# Patient Record
Sex: Male | Born: 1940
Health system: Southern US, Community
[De-identification: ages and names within clinical notes are randomized; demographics above are authoritative.]

## PROBLEM LIST (undated history)

## (undated) DIAGNOSIS — Z87442 Personal history of urinary calculi: Secondary | ICD-10-CM

## (undated) DIAGNOSIS — I10 Essential (primary) hypertension: Secondary | ICD-10-CM

## (undated) DIAGNOSIS — M1990 Unspecified osteoarthritis, unspecified site: Secondary | ICD-10-CM

## (undated) DIAGNOSIS — E785 Hyperlipidemia, unspecified: Secondary | ICD-10-CM

## (undated) HISTORY — DX: Essential (primary) hypertension: I10

## (undated) HISTORY — PX: OTHER SURGICAL HISTORY: SHX169

## (undated) HISTORY — DX: Hyperlipidemia, unspecified: E78.5

---

## 2002-07-15 ENCOUNTER — Ambulatory Visit (HOSPITAL_COMMUNITY): Admission: RE | Admit: 2002-07-15 | Discharge: 2002-07-15 | Payer: Self-pay | Admitting: Orthopaedic Surgery

## 2002-07-15 ENCOUNTER — Encounter: Payer: Self-pay | Admitting: Orthopaedic Surgery

## 2002-07-27 ENCOUNTER — Ambulatory Visit (HOSPITAL_BASED_OUTPATIENT_CLINIC_OR_DEPARTMENT_OTHER): Admission: RE | Admit: 2002-07-27 | Discharge: 2002-07-27 | Payer: Self-pay | Admitting: Orthopaedic Surgery

## 2004-12-06 ENCOUNTER — Emergency Department (HOSPITAL_COMMUNITY): Admission: EM | Admit: 2004-12-06 | Discharge: 2004-12-06 | Payer: Self-pay | Admitting: Emergency Medicine

## 2008-04-13 ENCOUNTER — Emergency Department (HOSPITAL_COMMUNITY): Admission: EM | Admit: 2008-04-13 | Discharge: 2008-04-13 | Payer: Self-pay | Admitting: Emergency Medicine

## 2008-08-03 ENCOUNTER — Ambulatory Visit: Payer: Self-pay | Admitting: Internal Medicine

## 2008-08-03 LAB — CONVERTED CEMR LAB
ALT: 23 units/L (ref 0–53)
Albumin: 3.9 g/dL (ref 3.5–5.2)
BUN: 13 mg/dL (ref 6–23)
Bilirubin Urine: NEGATIVE
Bilirubin, Direct: 0.1 mg/dL (ref 0.0–0.3)
CO2: 30 meq/L (ref 19–32)
Calcium: 8.8 mg/dL (ref 8.4–10.5)
Creatinine, Ser: 0.8 mg/dL (ref 0.4–1.5)
GFR calc Af Amer: 124 mL/min
Glucose, Bld: 102 mg/dL — ABNORMAL HIGH (ref 70–99)
HDL: 28.8 mg/dL — ABNORMAL LOW (ref >39.0)
Hemoglobin, Urine: NEGATIVE
Hemoglobin: 15.4 g/dL (ref 13.0–17.0)
Ketones, ur: NEGATIVE mg/dL
LDL Cholesterol: 79 mg/dL (ref 0–99)
Leukocytes, UA: NEGATIVE
Lymphocytes Relative: 15.5 % (ref 12.0–46.0)
MCHC: 34.3 g/dL (ref 30.0–36.0)
Monocytes Relative: 11 % (ref 3.0–12.0)
Neutrophils Relative %: 67.5 % (ref 43.0–77.0)
Nitrite: NEGATIVE
Platelets: 140 10*3/uL — ABNORMAL LOW (ref 150–400)
RDW: 12.7 % (ref 11.5–14.6)
TSH: 1.98 microintl units/mL (ref 0.35–5.50)
Total CHOL/HDL Ratio: 4.3
Total Protein: 6.3 g/dL (ref 6.0–8.3)
Urobilinogen, UA: 0.2 (ref 0.0–1.0)
pH: 5 (ref 5.0–8.0)

## 2008-08-09 ENCOUNTER — Encounter: Payer: Self-pay | Admitting: Internal Medicine

## 2008-08-25 ENCOUNTER — Ambulatory Visit: Payer: Self-pay | Admitting: Internal Medicine

## 2008-08-25 DIAGNOSIS — R351 Nocturia: Secondary | ICD-10-CM

## 2008-08-25 DIAGNOSIS — C029 Malignant neoplasm of tongue, unspecified: Secondary | ICD-10-CM | POA: Insufficient documentation

## 2008-08-25 DIAGNOSIS — N401 Enlarged prostate with lower urinary tract symptoms: Secondary | ICD-10-CM | POA: Insufficient documentation

## 2008-10-16 ENCOUNTER — Ambulatory Visit: Payer: Self-pay | Admitting: Internal Medicine

## 2008-10-16 DIAGNOSIS — M1710 Unilateral primary osteoarthritis, unspecified knee: Secondary | ICD-10-CM | POA: Insufficient documentation

## 2008-10-16 LAB — CONVERTED CEMR LAB
BUN: 17 mg/dL (ref 6–23)
Chloride: 110 meq/L (ref 96–112)
Creatinine, Ser: 0.8 mg/dL (ref 0.4–1.5)
Eosinophils Relative: 3.5 % (ref 0.0–5.0)
GFR calc Af Amer: 124 mL/min
Glucose, Bld: 101 mg/dL — ABNORMAL HIGH (ref 70–99)
HCT: 42.8 % (ref 39.0–52.0)
Monocytes Relative: 12.9 % — ABNORMAL HIGH (ref 3.0–12.0)
Neutrophils Relative %: 64.4 % (ref 43.0–77.0)
Platelets: 244 10*3/uL (ref 150–400)
Potassium: 4 meq/L (ref 3.5–5.1)
RDW: 12.7 % (ref 11.5–14.6)
WBC: 5.8 10*3/uL (ref 4.5–10.5)

## 2009-01-16 ENCOUNTER — Ambulatory Visit: Payer: Self-pay | Admitting: Internal Medicine

## 2009-05-01 ENCOUNTER — Emergency Department (HOSPITAL_COMMUNITY): Admission: EM | Admit: 2009-05-01 | Discharge: 2009-05-01 | Payer: Self-pay | Admitting: Emergency Medicine

## 2009-07-17 ENCOUNTER — Ambulatory Visit: Payer: Self-pay | Admitting: Internal Medicine

## 2010-01-04 IMAGING — CT CT PELVIS W/O CM
2 of 4 series · 16 of 46 positions shown, 18 images · non-contrast
Comparison: None

CT ABDOMEN

CLINICAL DATA: Right flank pain

CT ABDOMEN AND PELVIS WITHOUT CONTRAST
TECHNIQUE: Multidetector CT imaging of the abdomen and pelvis was
performed following the standard protocol without intravenous
contrast.

[Series 2: under 200# stone no prev · axial · 0.68mm/px · z∈[+926,+1330]mm · 13 of 89 slices shown, 15 images]
[im 4/89  soft-tissue]
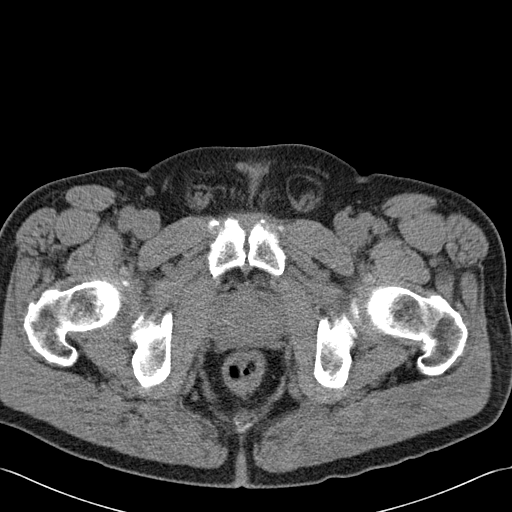
[im 4/89  bone]
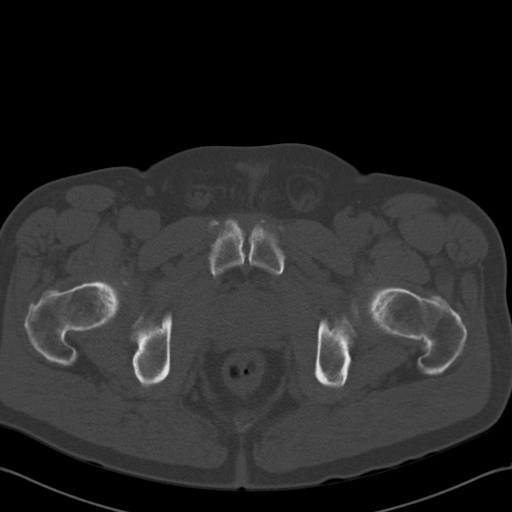
[im 11/89  soft-tissue]
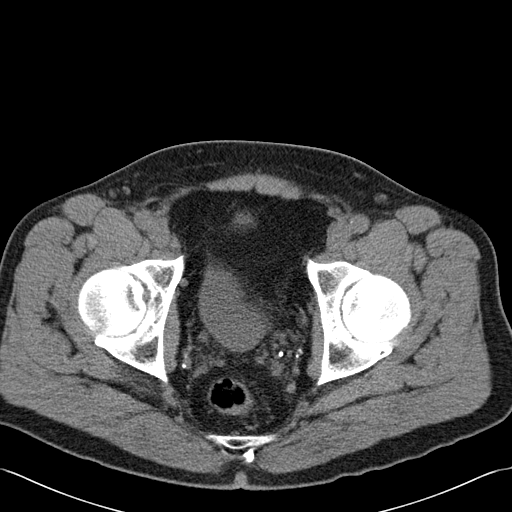
[im 18/89  soft-tissue]
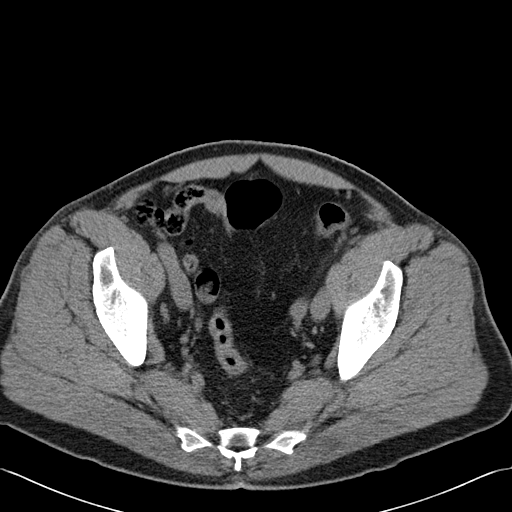
[im 25/89  soft-tissue]
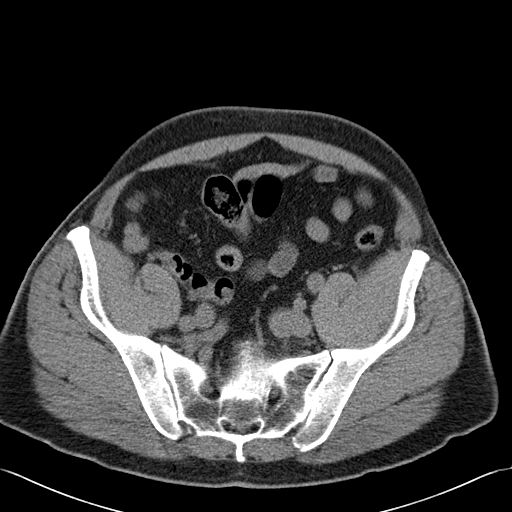
[im 32/89  soft-tissue]
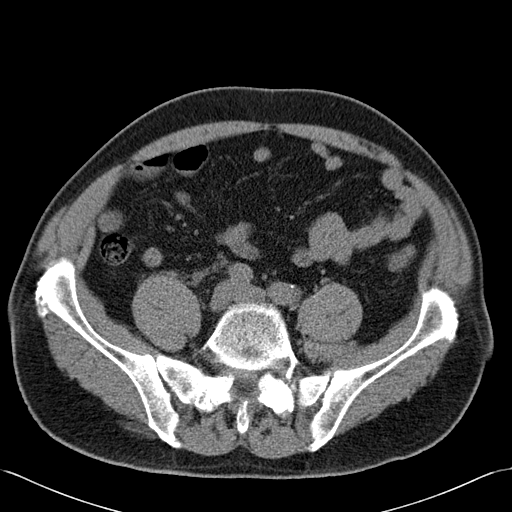
[im 39/89  soft-tissue]
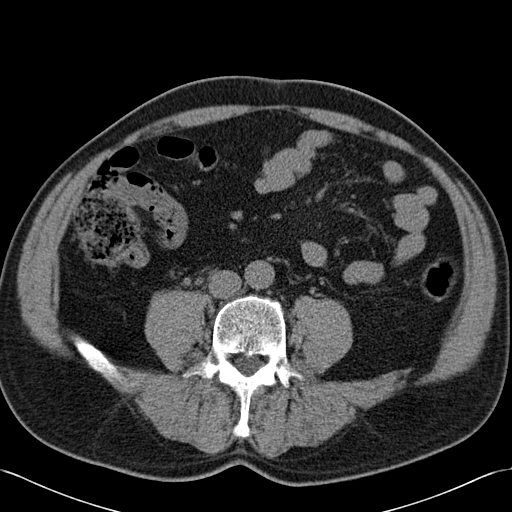
[im 46/89  soft-tissue]
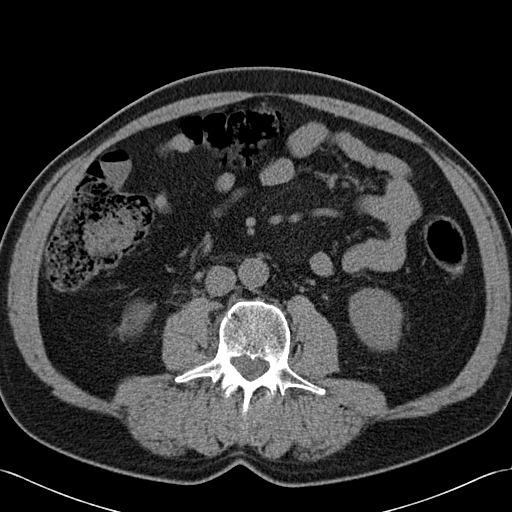
[im 50/89  soft-tissue]
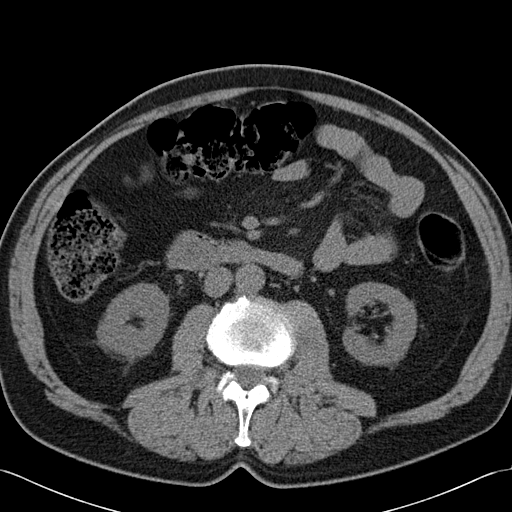
[im 57/89  soft-tissue]
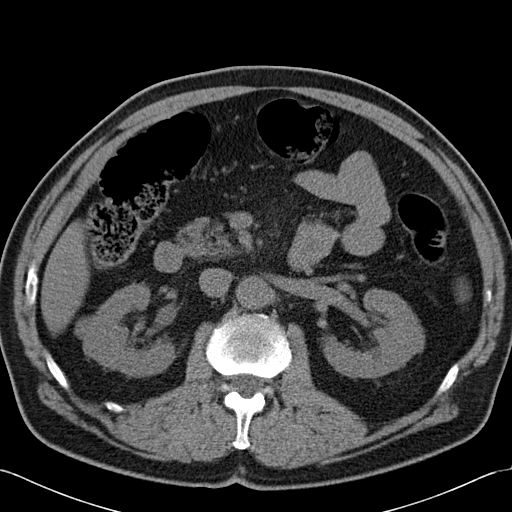
[im 57/89  bone]
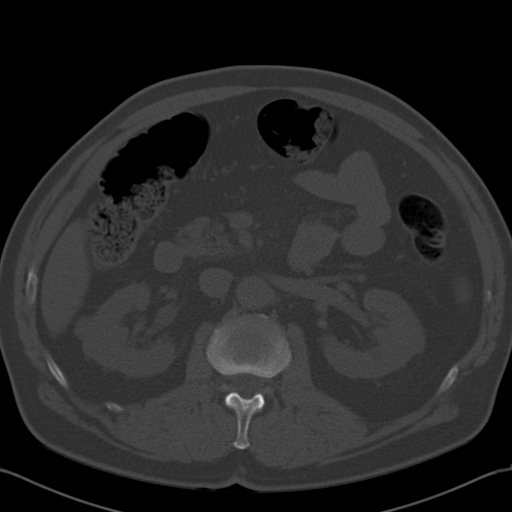
[im 64/89  soft-tissue]
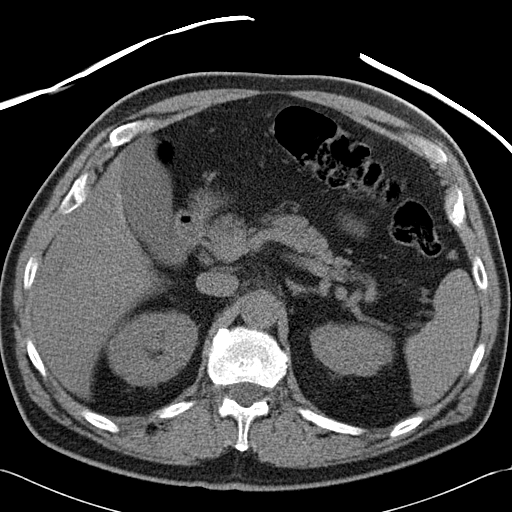
[im 71/89  soft-tissue]
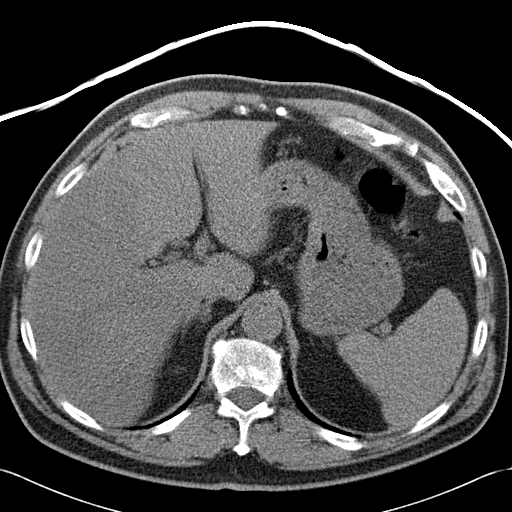
[im 78/89  soft-tissue]
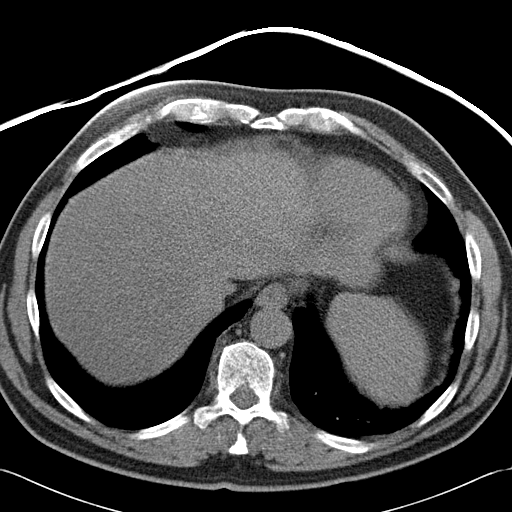
[im 85/89  soft-tissue]
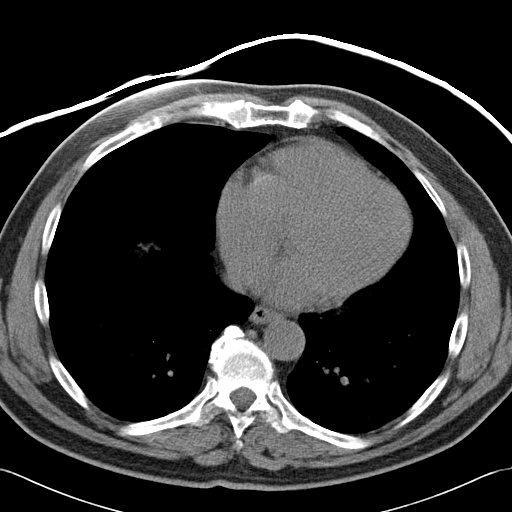

[Series 602: <mpr thick range> · coronal · 0.90mm/px · 3 of 84 slices shown]
[im 28/84  soft-tissue]
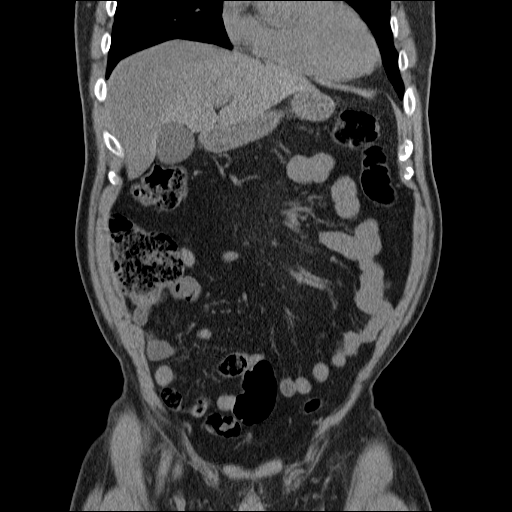
[im 37/84  soft-tissue]
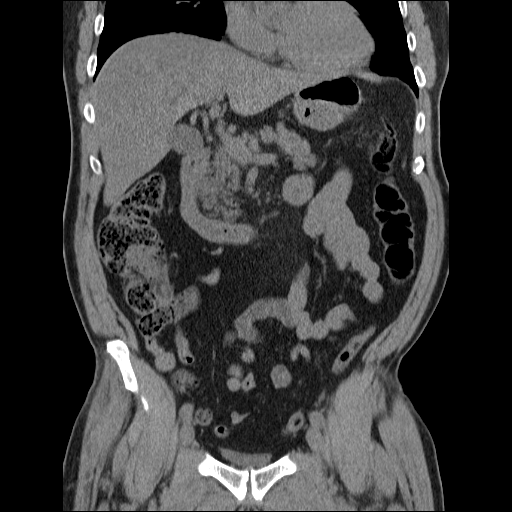
[im 47/84  soft-tissue]
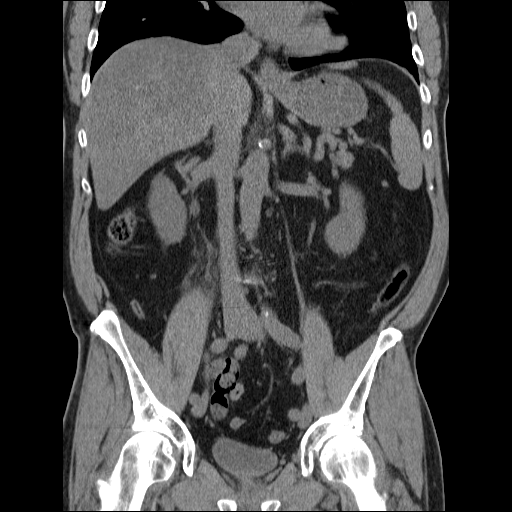

[16 of 46 positions shown; findings below may reference images not displayed]

FINDINGS: No pleural effusion or consolidation at the lung bases.
Bibasilar atelectasis.

There is mild right hydroureteronephrosis and right perinephric and
periureteric stranding.

No intrarenal calculi are identified in either kidney.  There is no
hydronephrosis on the left.  The left ureter is normal in caliber.
There is an exophytic low density lesion extending from the upper
pole of the right kidney measuring 1.8 cm and is not further
characterized.  There is an isodense exophytic lesion extending
from the mid pole of the right kidney measuring 1.5 cm.

The liver is somewhat heterogeneous in attenuation, and
demonstrates fatty infiltration, more prominent in the right lobe
of the left.  The gallbladder, spleen, adrenal glands, and pancreas
are within normal limits.  Bowel loops are normal in caliber.  The
vertebral bodies are normal in height and alignment.  Facet joint
degenerative changes at L5, S1.
IMPRESSION: Mild right hydroureteronephrosis.  This is secondary to a right
ureteral vesicle junction versus urinary bladder stone (please see
the pelvis section below).
2.  Fatty infiltration of the liver.
3.  Two exophytic lesions associated with the right kidney are not
further characterized on noncontrast imaging.

CT PELVIS
FINDINGS: A 2 mm stone is within the right side of the urinary
bladder,  likely recently passed from the distal ureter.  The
urinary bladder is otherwise unremarkable.  The distal right ureter
is slightly prominent in caliber and there is some periureteric
stranding near the level of the pelvic brim.  The prostate gland
contains calcifications.  Seminal vesicles are unremarkable.  The
appendix is normal. Pelvic bowel loops are normal in caliber.
There is no pelvic free fluid or lymphadenopathy.  No acute bony
findings.
IMPRESSION: 2 mm stone in the right aspect of the urinary bladder suggests
recent passage of a right-sided ureteral stone.  There is mild
associated right hydroureteronephrosis and stranding.

## 2010-03-07 ENCOUNTER — Ambulatory Visit: Payer: Self-pay | Admitting: Internal Medicine

## 2010-03-07 DIAGNOSIS — I10 Essential (primary) hypertension: Secondary | ICD-10-CM | POA: Insufficient documentation

## 2010-03-07 LAB — CONVERTED CEMR LAB
ALT: 19 units/L (ref 0–53)
BUN: 14 mg/dL (ref 6–23)
Basophils Absolute: 0 10*3/uL (ref 0.0–0.1)
Chloride: 105 meq/L (ref 96–112)
Creatinine, Ser: 0.9 mg/dL (ref 0.4–1.5)
Eosinophils Absolute: 0.2 10*3/uL (ref 0.0–0.7)
Glucose, Bld: 94 mg/dL (ref 70–99)
HCT: 44.3 % (ref 39.0–52.0)
Hgb A1c MFr Bld: 5.8 % (ref 4.6–6.5)
Lymphs Abs: 1.9 10*3/uL (ref 0.7–4.0)
MCV: 93 fL (ref 78.0–100.0)
Monocytes Absolute: 0.8 10*3/uL (ref 0.1–1.0)
Neutrophils Relative %: 55.5 % (ref 43.0–77.0)
Platelets: 174 10*3/uL (ref 150.0–400.0)
Potassium: 4.6 meq/L (ref 3.5–5.1)
RDW: 13.7 % (ref 11.5–14.6)
TSH: 2.07 microintl units/mL (ref 0.35–5.50)
Total Bilirubin: 0.6 mg/dL (ref 0.3–1.2)

## 2010-03-08 ENCOUNTER — Encounter: Payer: Self-pay | Admitting: Internal Medicine

## 2010-04-05 ENCOUNTER — Telehealth (INDEPENDENT_AMBULATORY_CARE_PROVIDER_SITE_OTHER): Payer: Self-pay | Admitting: *Deleted

## 2010-05-20 ENCOUNTER — Ambulatory Visit: Payer: Self-pay | Admitting: Internal Medicine

## 2010-09-04 ENCOUNTER — Telehealth (INDEPENDENT_AMBULATORY_CARE_PROVIDER_SITE_OTHER): Payer: Self-pay | Admitting: *Deleted

## 2010-10-04 ENCOUNTER — Ambulatory Visit: Payer: Self-pay | Admitting: Internal Medicine

## 2010-11-15 ENCOUNTER — Telehealth (INDEPENDENT_AMBULATORY_CARE_PROVIDER_SITE_OTHER): Payer: Self-pay | Admitting: *Deleted

## 2010-11-26 NOTE — Assessment & Plan Note (Signed)
Summary: STRESS--BP YESTER:  170/96--LAST PM--154/88--STC   Vital Signs:  Patient profile:   70 year old male Height:      70 inches Weight:      196.75 pounds BMI:     28.33 O2 Sat:      96 % on Room air Temp:     98.5 degrees F oral Pulse rate:   60 / minute Pulse rhythm:   regular Resp:     16 per minute BP sitting:   160 / 90  (left arm) Cuff size:   large  Vitals Entered By: Rock Nephew CMA (Mar 07, 2010 2:09 PM)  Nutrition Counseling: Patient's BMI is greater than 25 and therefore counseled on weight management options.  O2 Flow:  Room air CC: Discuss elevated blood pressure    Primary Care Provider:  Etta Grandchild MD  CC:  Discuss elevated blood pressure .  History of Present Illness: He returns for a BP check but is also grieving the death of his wife who died suddenly 4 months ago after a surgical procedure. He was having insomnia but that is much better now. He has crying spells, anger, and sadness but is still working.  Preventive Screening-Counseling & Management  Alcohol-Tobacco     Alcohol drinks/day: 0     Smoking Status: never     Year Quit: 2005     Pack years: 30     Passive Smoke Exposure: no  Hep-HIV-STD-Contraception     HIV Risk: no      Drug Use:  never.        Blood Transfusions:  no.    Clinical Review Panels:  Lipid Management   Cholesterol:  124 (08/03/2008)   LDL (bad choesterol):  79 (08/03/2008)   HDL (good cholesterol):  28.8 (08/03/2008)  Diabetes Management   HgBA1C:  5.7 (10/16/2008)   Creatinine:  0.8 (10/16/2008)  CBC   WBC:  5.8 (10/16/2008)   RBC:  4.64 (10/16/2008)   Hgb:  15.0 (10/16/2008)   Hct:  42.8 (10/16/2008)   Platelets:  244 (10/16/2008)   MCV  92.2 (10/16/2008)   MCHC  35.1 (10/16/2008)   RDW  12.7 (10/16/2008)   PMN:  64.4 (10/16/2008)   Lymphs:  18.8 (10/16/2008)   Monos:  12.9 (10/16/2008)   Eosinophils:  3.5 (10/16/2008)   Basophil:  0.4 (10/16/2008)  Complete Metabolic Panel  Glucose:  101 (10/16/2008)   Sodium:  142 (10/16/2008)   Potassium:  4.0 (10/16/2008)   Chloride:  110 (10/16/2008)   CO2:  27 (10/16/2008)   BUN:  17 (10/16/2008)   Creatinine:  0.8 (10/16/2008)   Albumin:  3.9 (08/03/2008)   Total Protein:  6.3 (08/03/2008)   Calcium:  9.0 (10/16/2008)   Total Bili:  0.7 (08/03/2008)   Alk Phos:  55 (08/03/2008)   SGPT (ALT):  23 (08/03/2008)   SGOT (AST):  20 (08/03/2008)   Medications Prior to Update: 1)  Aleve 220 Mg Caps (Naproxen Sodium) .... As Needed  Current Medications (verified): 1)  Aleve 220 Mg Caps (Naproxen Sodium) .... As Needed 2)  Diovan Hct 320-12.5 Mg Tabs (Valsartan-Hydrochlorothiazide) .... One By Mouth Once Daily For High Blood Pressure  Allergies (verified): No Known Drug Allergies  Past History:  Past Medical History: low platelets hyperglycemia Osteoarthritis Hypertension  Past Surgical History: Reviewed history from 08/25/2008 and no changes required. Denies surgical history  Family History: Reviewed history from 08/25/2008 and no changes required. Family History Diabetes 1st degree relative Family History Hypertension  Family History of Prostate CA 1st degree relative <50  Social History: Reviewed history from 10/16/2008 and no changes required. 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   Psych:  Complains of easily tearful; denies alternate hallucination ( auditory/visual), anxiety, depression, easily angered, irritability, panic attacks, sense of great danger, suicidal thoughts/plans, thoughts of violence, unusual visions or sounds, and thoughts /plans of harming others. Heme:  Denies abnormal bruising, bleeding,  enlarge lymph nodes, fevers, pallor, and skin discoloration.  Physical Exam  General:  alert, well-developed, well-nourished, and well-hydrated.   Mouth:  good dentition, pharynx pink and moist, no erythema, no exudates, and no posterior lymphoid hypertrophy.   Neck:  supple, full ROM, no masses, and no thyromegaly.   Lungs:  Normal respiratory effort, chest expands symmetrically. Lungs are clear to auscultation, no crackles or wheezes. Heart:  Normal rate and regular rhythm. S1 and S2 normal without gallop, murmur, click, rub or other extra sounds. Abdomen:  soft, non-tender, normal bowel sounds, no hepatomegaly, and no splenomegaly.   Msk:  No deformity or scoliosis noted of thoracic or lumbar spine.   Pulses:  R and L carotid,radial,femoral,dorsalis pedis and posterior tibial pulses are full and equal bilaterally Extremities:  trace left pedal edema and trace right pedal edema.   Neurologic:  No cranial nerve deficits noted. Station and gait are normal. Plantar reflexes are down-going bilaterally. DTRs are symmetrical throughout. Sensory, motor and coordinative functions appear intact. Skin:  Intact without suspicious lesions or rashes Psych:  Cognition and judgment appear intact. Alert and cooperative with normal attention span and concentration. No apparent delusions, illusions, hallucinations   Impression & Recommendations:  Problem # 1:  HYPERTENSION (ICD-401.9) Assessment New  His updated medication list for this problem includes:    Diovan Hct 320-12.5 Mg Tabs (Valsartan-hydrochlorothiazide) ..... One by mouth once daily for high blood pressure  BP today: 160/90 Prior BP: 140/88 (07/17/2009)  Labs Reviewed: K+: 4.0 (10/16/2008) Creat: : 0.8 (10/16/2008)   Chol: 124 (08/03/2008)   HDL: 28.8 (08/03/2008)   LDL: 79 (08/03/2008)   TG: 80 (08/03/2008)  Problem # 2:  GRIEF REACTION, ACUTE (ICD-309.0) Assessment: New it sounds to me like he is grieving normally and I don't think  he needs an anti-depressant at this time but I did ask him to start therapy. Orders: Psychology Referral (Psychology)  Problem # 3:  THROMBOCYTOPENIA (ICD-287.5) Assessment: Unchanged  Orders: Venipuncture (06237) TLB-BMP (Basic Metabolic Panel-BMET) (80048-METABOL) TLB-CBC Platelet - w/Differential (85025-CBCD) TLB-Hepatic/Liver Function Pnl (80076-HEPATIC) TLB-TSH (Thyroid Stimulating Hormone) (84443-TSH) TLB-A1C / Hgb A1C (Glycohemoglobin) (83036-A1C)  Complete Medication List: 1)  Aleve 220 Mg Caps (Naproxen sodium) .... As needed 2)  Diovan Hct 320-12.5 Mg Tabs (Valsartan-hydrochlorothiazide) .... One by mouth once daily for high blood pressure  Patient Instructions: 1)  Please schedule a follow-up appointment in 1 month. 2)  Check your Blood Pressure regularly. If it is above 140/90: you should make an appointment. Prescriptions: DIOVAN HCT 320-12.5 MG TABS (VALSARTAN-HYDROCHLOROTHIAZIDE) One by mouth once daily for high blood pressure  #112 x 0   Entered and Authorized by:   Etta Grandchild MD   Signed by:   Etta Grandchild MD on 03/07/2010   Method used:   Samples Given   RxID:   [id number]

## 2010-11-26 NOTE — Assessment & Plan Note (Signed)
Summary: FOLLOW UP-LB   Vital Signs:  Patient profile:   70 year old male Height:      70 inches Weight:      199 pounds BMI:     28.66 O2 Sat:      97 % on Room air Temp:     98.0 degrees F Pulse rate:   69 / minute Pulse rhythm:   regular Resp:     16 per minute BP sitting:   118 / 70  (left arm) Cuff size:   large  Vitals Entered By: Rock Nephew CMA (May 20, 2010 8:07 AM)  Nutrition Counseling: Patient's BMI is greater than 25 and therefore counseled on weight management options.  O2 Flow:  Room air CC: follow-up visit Is Patient Diabetic? No Pain Assessment Patient in pain? no        Primary Care Provider:  Etta Grandchild MD  CC:  follow-up visit.  History of Present Illness:  Hypertension Follow-Up      This is a 70 year old man who presents for Hypertension follow-up.  The patient reports lightheadedness, but denies urinary frequency, headaches, edema, impotence, rash, and fatigue.  The patient denies the following associated symptoms: chest pain, chest pressure, exercise intolerance, dyspnea, palpitations, syncope, leg edema, and pedal edema.  Compliance with medications (by patient report) has been poor.  The patient reports that dietary compliance has been good.  The patient reports exercising 3-4X per week.  Adjunctive measures currently used by the patient include salt restriction and relaxation.    Preventive Screening-Counseling & Management  Alcohol-Tobacco     Alcohol drinks/day: 0     Smoking Status: never     Year Quit: 2005     Pack years: 30     Passive Smoke Exposure: no  Hep-HIV-STD-Contraception     HIV Risk: no  Clinical Review Panels:  Diabetes Management   HgBA1C:  5.8 (03/07/2010)   Creatinine:  0.9 (03/07/2010)  CBC   WBC:  6.6 (03/07/2010)   RBC:  4.77 (03/07/2010)   Hgb:  15.4 (03/07/2010)   Hct:  44.3 (03/07/2010)   Platelets:  174.0 (03/07/2010)   MCV  93.0 (03/07/2010)   MCHC  34.7 (03/07/2010)   RDW  13.7  (03/07/2010)   PMN:  55.5 (03/07/2010)   Lymphs:  29.1 (03/07/2010)   Monos:  12.0 (03/07/2010)   Eosinophils:  3.0 (03/07/2010)   Basophil:  0.4 (03/07/2010)  Complete Metabolic Panel   Glucose:  94 (03/07/2010)   Sodium:  143 (03/07/2010)   Potassium:  4.6 (03/07/2010)   Chloride:  105 (03/07/2010)   CO2:  31 (03/07/2010)   BUN:  14 (03/07/2010)   Creatinine:  0.9 (03/07/2010)   Albumin:  4.3 (03/07/2010)   Total Protein:  6.9 (03/07/2010)   Calcium:  9.0 (03/07/2010)   Total Bili:  0.6 (03/07/2010)   Alk Phos:  67 (03/07/2010)   SGPT (ALT):  19 (03/07/2010)   SGOT (AST):  21 (03/07/2010)   Medications Prior to Update: 1)  Aleve 220 Mg Caps (Naproxen Sodium) .... As Needed 2)  Diovan Hct 320-12.5 Mg Tabs (Valsartan-Hydrochlorothiazide) .... One By Mouth Once Daily For High Blood Pressure  Current Medications (verified): 1)  Aleve 220 Mg Caps (Naproxen Sodium) .... As Needed 2)  Asa 81mg  .... 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 Family History Diabetes 1st degree relative Family History Hypertension Family History of Prostate CA 1st degree relative <50  Social  History: Reviewed history from 10/16/2008 and no changes required. 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    Physical Exam  General:  alert, well-developed, well-nourished, and well-hydrated.   Mouth:  good dentition, pharynx pink and moist, no erythema, no exudates, and no posterior lymphoid hypertrophy.   Neck:  supple, full ROM, no masses, and no thyromegaly.   Lungs:  Normal respiratory effort, chest expands symmetrically. Lungs are clear to auscultation, no crackles or wheezes. Heart:  Normal rate and regular rhythm. S1 and S2 normal without gallop, murmur, click, rub or other extra sounds. Abdomen:  soft, non-tender, normal bowel sounds, no hepatomegaly, and no splenomegaly.   Msk:  No deformity or scoliosis noted of thoracic or lumbar spine.   Pulses:  R and L carotid,radial,femoral,dorsalis pedis and posterior tibial pulses are full and equal bilaterally Extremities:  trace left pedal edema and trace right pedal edema.   Neurologic:  No cranial nerve deficits noted. Station and gait are normal. Plantar reflexes are down-going bilaterally. DTRs are symmetrical throughout. Sensory, motor and coordinative functions appear intact. Skin:  Intact without suspicious lesions or rashes Psych:  Cognition and judgment appear intact. Alert and cooperative with normal attention span and concentration. No apparent delusions, illusions, hallucinations   Impression & Recommendations:  Problem # 1:  HYPERTENSION (ICD-401.9) Assessment Improved  The following medications were removed from the medication list:    Diovan Hct 320-12.5 Mg Tabs (Valsartan-hydrochlorothiazide) ..... One by mouth once daily for high blood pressure  BP today: 118/70 Prior BP: 160/90 (03/07/2010)  Labs  Reviewed: K+: 4.6 (03/07/2010) Creat: : 0.9 (03/07/2010)   Chol: 124 (08/03/2008)   HDL: 28.8 (08/03/2008)   LDL: 79 (08/03/2008)   TG: 80 (08/03/2008)  Complete Medication List: 1)  Aleve 220 Mg Caps (Naproxen sodium) .... As needed 2)  Asa 81mg   .... Take 1 tablet by mouth once a day  Patient Instructions: 1)  Please schedule a follow-up appointment in 6 months. 2)  Check your Blood Pressure regularly. If it is above 140/90: you should make an appointment.    Not Administered:    Pneumonia Vaccine not given due to: declined

## 2010-11-26 NOTE — Progress Notes (Signed)
Summary: RESULTS  Phone Note Call from Patient Call back at [phone] OK VM    Summary of Call: Patient is requesting results of labs.  Initial call taken by: Lamar Sprinkles, CMA,  April 05, 2010 10:11 AM  Follow-up for Phone Call        normal Follow-up by: Etta Grandchild MD,  April 05, 2010 10:12 AM  Additional Follow-up for Phone Call Additional follow up Details #1::        called Additional Follow-up by: Raechel Ache, RN,  April 05, 2010 11:43 AM

## 2010-11-26 NOTE — Assessment & Plan Note (Signed)
Summary: bp and followup for employment-lb   Vital Signs:  Patient profile:   70 year old male Height:      70 inches Weight:      197.13 pounds BMI:     28.39 O2 Sat:      97 % on Room air Temp:     98.7 degrees F oral Pulse rate:   77 / minute Pulse rhythm:   regular Resp:     16 per minute BP sitting:   136 / 82  (left arm) Cuff size:   large  Vitals Entered By: Rock Nephew CMA (October 04, 2010 8:25 AM)  Nutrition Counseling: Patient's BMI is greater than 25 and therefore counseled on weight management options.  O2 Flow:  Room air CC: follow-up visit Is Patient Diabetic? No Pain Assessment Patient in pain? no       Does patient need assistance? Functional Status Self care Ambulation Normal   Primary Care Provider:  Etta Grandchild MD  CC:  follow-up visit.  History of Present Illness:  Hypertension Follow-Up      This is a 69 year old man who presents for Hypertension follow-up.  The patient denies lightheadedness, urinary frequency, headaches, edema, impotence, rash, and fatigue.  The patient denies the following associated symptoms: chest pain, chest pressure, exercise intolerance, dyspnea, palpitations, syncope, leg edema, and pedal edema.  The patient reports that dietary compliance has been excellent.  The patient reports exercising 3-4X per week.  Adjunctive measures currently used by the patient include salt restriction and relaxation.    Preventive Screening-Counseling & Management  Alcohol-Tobacco     Alcohol drinks/day: 0     Alcohol Counseling: not indicated; patient does not drink     Smoking Status: never     Year Quit: 2005     Pack years: 30     Passive Smoke Exposure: no     Tobacco Counseling: not indicated; no tobacco use  Hep-HIV-STD-Contraception     Hepatitis Risk: no risk noted     HIV Risk: no     STD Risk: no risk noted      Drug Use:  never.        Blood Transfusions:  no.    Clinical Review Panels:  Prevention   Last  PSA:  0.61 (08/03/2008)  Immunizations   Last Tetanus Booster:  Td (08/28/2007)  Lipid Management   Cholesterol:  124 (08/03/2008)   LDL (bad choesterol):  79 (08/03/2008)   HDL (good cholesterol):  28.8 (08/03/2008)  Diabetes Management   HgBA1C:  5.8 (03/07/2010)   Creatinine:  0.9 (03/07/2010)  CBC   WBC:  6.6 (03/07/2010)   RBC:  4.77 (03/07/2010)   Hgb:  15.4 (03/07/2010)   Hct:  44.3 (03/07/2010)   Platelets:  174.0 (03/07/2010)   MCV  93.0 (03/07/2010)   MCHC  34.7 (03/07/2010)   RDW  13.7 (03/07/2010)   PMN:  55.5 (03/07/2010)   Lymphs:  29.1 (03/07/2010)   Monos:  12.0 (03/07/2010)   Eosinophils:  3.0 (03/07/2010)   Basophil:  0.4 (03/07/2010)  Complete Metabolic Panel   Glucose:  94 (03/07/2010)   Sodium:  143 (03/07/2010)   Potassium:  4.6 (03/07/2010)   Chloride:  105 (03/07/2010)   CO2:  31 (03/07/2010)   BUN:  14 (03/07/2010)   Creatinine:  0.9 (03/07/2010)   Albumin:  4.3 (03/07/2010)   Total Protein:  6.9 (03/07/2010)   Calcium:  9.0 (03/07/2010)   Total Bili:  0.6 (03/07/2010)  Alk Phos:  67 (03/07/2010)   SGPT (ALT):  19 (03/07/2010)   SGOT (AST):  21 (03/07/2010)   Medications Prior to Update: 1)  Aleve 220 Mg Caps (Naproxen Sodium) .... As Needed 2)  Asa 81mg  .... Take 1 Tablet By Mouth Once A Day  Current Medications (verified): 1)  Aleve 220 Mg Caps (Naproxen Sodium) .... As Needed 2)  Asa 81mg  .... Take 1 Tablet By Mouth Once A Day  Allergies (verified): No Known Drug Allergies  Past History:  Past Medical History: Last updated: 03/07/2010 low platelets hyperglycemia Osteoarthritis Hypertension  Past Surgical History: Last updated: 08/25/2008 Denies surgical history  Family History: Last updated: 08/25/2008 Family History Diabetes 1st degree relative Family History Hypertension Family History of Prostate CA 1st degree relative <50  Social History: Last updated: 10/04/2010 Occupation: Radio Pensions consultant for [employer] Alcohol use-no Drug use-no Regular exercise-no Widow/Widower  Risk Factors: Alcohol Use: 0 (10/04/2010) Exercise: no (08/25/2008)  Risk Factors: Smoking Status: never (10/04/2010) Passive Smoke Exposure: no (10/04/2010)  Family History: Reviewed history from 08/25/2008 and no changes required. Family History Diabetes 1st degree relative Family History Hypertension Family History of Prostate CA 1st degree relative <50  Social History: Reviewed history from 10/16/2008 and no changes required. Occupation: Chartered certified accountant for [employer] Alcohol use-no Drug use-no Regular exercise-no Widow/Widower Hepatitis Risk:  no risk noted STD Risk:  no risk noted  Review of Systems  The patient denies anorexia, fever, weight loss, weight gain, chest pain, dyspnea on exertion, peripheral edema, prolonged cough, headaches, hemoptysis, abdominal pain, hematuria, suspicious skin lesions, transient blindness, difficulty walking, depression, abnormal bleeding, enlarged lymph nodes, and angioedema.    Physical Exam  General:  alert, well-developed, well-nourished, well-hydrated, appropriate dress, and normal appearance.   Head:  normocephalic, atraumatic, no abnormalities observed, and no abnormalities palpated.   Eyes:  vision grossly intact, pupils equal, and no injection.   Ears:  R ear normal and L ear normal.   Nose:  External nasal examination shows no deformity or inflammation. Nasal mucosa are pink and moist without lesions or exudates. Mouth:  Oral mucosa and oropharynx without lesions or exudates.  Teeth in good repair. Neck:  supple, full ROM, and no masses.   Lungs:  normal respiratory effort, no intercostal retractions, no accessory muscle use, normal breath sounds, no dullness, no fremitus, no crackles, and no wheezes.   Heart:  normal rate, regular rhythm, no murmur, no gallop, no rub, no JVD, and no HJR.   Abdomen:  soft, non-tender, normal bowel  sounds, no distention, no masses, no guarding, no rigidity, no rebound tenderness, no abdominal hernia, no inguinal hernia, no hepatomegaly, and no splenomegaly.   Msk:  normal ROM, no joint tenderness, no joint swelling, no joint warmth, no redness over joints, and no joint deformities.   Pulses:  R and L carotid,radial,femoral,dorsalis pedis and posterior tibial pulses are full and equal bilaterally Extremities:  No clubbing, cyanosis, edema, or deformity noted with normal full range of motion of all joints.   Neurologic:  No cranial nerve deficits noted. Station and gait are normal. Plantar reflexes are down-going bilaterally. DTRs are symmetrical throughout. Sensory, motor and coordinative functions appear intact. Skin:  turgor normal, color normal, no rashes, no suspicious lesions, no ecchymoses, no petechiae, no purpura, no ulcerations, and no edema.   Cervical Nodes:  no anterior cervical adenopathy and no posterior cervical adenopathy.   Axillary Nodes:  no R axillary adenopathy and no L axillary adenopathy.  Inguinal Nodes:  no R inguinal adenopathy and no L inguinal adenopathy.   Psych:  Cognition and judgment appear intact. Alert and cooperative with normal attention span and concentration. No apparent delusions, illusions, hallucinations   Impression & Recommendations:  Problem # 1:  HYPERTENSION (ICD-401.9) Assessment Improved  BP today: 136/82 Prior BP: 118/70 (05/20/2010)  Labs Reviewed: K+: 4.6 (03/07/2010) Creat: : 0.9 (03/07/2010)   Chol: 124 (08/03/2008)   HDL: 28.8 (08/03/2008)   LDL: 79 (08/03/2008)   TG: 80 (08/03/2008)  Problem # 2:  OSTEOARTHRITIS (ICD-715.90) Assessment: Improved  His updated medication list for this problem includes:    Aleve 220 Mg Caps (Naproxen sodium) .Marland Kitchen... As needed  Discussed use of medications, application of heat or cold, and exercises.   Complete Medication List: 1)  Aleve 220 Mg Caps (Naproxen sodium) .... As needed 2)  Asa 81mg    .... Take 1 tablet by mouth once a day  Patient Instructions: 1)  Please schedule a follow-up appointment in 6 months. 2)  It is important that you exercise regularly at least 20 minutes 5 times a week. If you develop chest pain, have severe difficulty breathing, or feel very tired , stop exercising immediately and seek medical attention. 3)  Check your Blood Pressure regularly. If it is above 140/90: you should make an appointment. 4)  Take 650-1000mg  of Tylenol every 4-6 hours as needed for relief of pain or comfort of fever AVOID taking more than 4000mg   in a 24 hour period (can cause liver damage in higher doses).   Orders Added: 1)  Est. Patient Level III [32951]

## 2010-11-26 NOTE — Progress Notes (Signed)
  Phone Note Other Incoming   Request: Send information Summary of Call: Request for records received from [facility]. Request forwarded to [facility].     

## 2010-11-28 NOTE — Progress Notes (Signed)
  Phone Note Other Incoming   Request: Send information Summary of Call: Request for records received from [facility]. Request forwarded to [facility].

## 2010-11-29 NOTE — Letter (Signed)
Summary: Pt does not want schedule/[facility]  Pt does not want schedule/[facility]   Imported By: Sherian Rein 03/12/2010 11:40:03  _____________________________________________________________________  External Attachment:    Type:   Image     Comment:   External Document

## 2011-02-02 LAB — URINALYSIS, ROUTINE W REFLEX MICROSCOPIC
Bilirubin Urine: NEGATIVE
Nitrite: NEGATIVE
Specific Gravity, Urine: 1.023 (ref 1.005–1.030)
Urobilinogen, UA: 1 mg/dL (ref 0.0–1.0)

## 2011-03-14 NOTE — Op Note (Signed)
NAME:  LUCAS, WINOGRAD                             ACCOUNT NO.:  [id number]   MEDICAL RECORD NO.:  [id number]                   PATIENT TYPE:  AMB   LOCATION:  [facility]                                  FACILITY:  [facility]   PHYSICIAN:  Claude Manges. Cleophas Dunker, M.D.            DATE OF BIRTH:  August 25, 1941   DATE OF PROCEDURE:  07/27/2002  DATE OF DISCHARGE:                                 OPERATIVE REPORT   PREOPERATIVE DIAGNOSES:  1. Tear of lateral meniscus, left knee.  2. Tricompartmental chondromalacia.   POSTOPERATIVE DIAGNOSES:  1. Tear of lateral meniscus, left knee.  2. Tricompartmental chondromalacia.  3. Tear of medial meniscus.   OPERATION PERFORMED:  1. Diagnostic arthroscopy of left knee.  2. Partial medial and lateral meniscectomy.  3. Shaving of three compartments.   SURGEON:  Claude Manges. Cleophas Dunker, M.D.   ANESTHESIA:  IV sedation and local Marcaine with epinephrine.   COMPLICATIONS:  None.   INDICATIONS FOR PROCEDURE:  The patient is a 70 year old gentleman with a  history of left knee problems since the end of August.  He has been treated  with cortisone injection and anti-inflammatory medicines with only transient  relief of his pain.  He has had an injury and prior to that was not having  too much trouble, so accordingly an MRI scan was performed revealing  tricompartmental degenerative changes, most severe in the lateral  compartment and extensive tearing of the anterior horn of the lateral  meniscus.  He had complex debris filled ganglion or fibrosis adjacent to the  popliteus tendon below the level of the joint space.  He is now to have  arthroscopic evaluation.   DESCRIPTION OF PROCEDURE:  With the patient comfortable on the operating  table and under IV sedation, the left lower extremity was placed in a thigh  holder, the leg was then prepped with DuraPrep from the thigh holder to the  ankle.  Sterile draping was performed.  0.5% Marcaine with epinephrine was  injected in either side of the patellar tendon.  Small puncture sites were  made.   Diagnostic arthroscopy was performed revealing it small with clear yellow  joint effusion.  Diagnostic findings revealed moderate synovitis in the  superior pouch.  There was diffuse thinning of the articular cartilage of  the patella.  These were shaved.  There was also a superior pouch plica that  was shaved and then secured with the ArthroCare want to prevent any  bleeding.   The medial compartment revealed some thinning of the articular cartilage  without exposed subchondral bone but there was a tear of the posterior horn,  flap tear of the posterior horn of the medial meniscus.  This was debrided  with the intra-articular shaver, the forceps and then the ArthroCare wand.  I shaved some of the frayed portions of the articular surface, particularly  on the femoral condyle.   The  ACL appeared to be thinned.  There was some separation between the  anterior and the posterior fibers but there was negative anterior drawer  sign.  The sheath around the ACL was not present.  The lateral compartment  was then evaluated.  There was a complex tear of the anterior third of the  lateral meniscus.  There were multiple fragments.  These were debrided with  the intra-articular shaver and then tapered along the central third.  There  was some fraying of the posterior third of the meniscus.  I did not see the  identified ganglion on the MRI scan but did probe beneath the meniscus.  There was diffuse chondromalacia of the femoral condyle which was shaved and  there was one area of exposed bone on the lateral femoral condyle.   The joint was then explored for evidence of loose material.  Two stab wounds  were left open and reinfiltrated with 0.5% Marcaine with epinephrine.  Sterile bulky dressing was applied followed by an Ace bandage.   PLAN:  Percocet for pain.  Office one week.                                                  Claude Manges. Cleophas Dunker, M.D.    PWW/MEDQ  D:  07/27/2002  T:  07/27/2002  Job:  [id number]

## 2011-07-24 LAB — BASIC METABOLIC PANEL WITH GFR
BUN: 15
CO2: 25
Chloride: 109
Creatinine, Ser: 1.43
GFR calc Af Amer: >60
Potassium: 4

## 2011-07-24 LAB — CBC
HCT: 44.3
Hemoglobin: 15
MCHC: 33.8
MCV: 92.2
Platelets: UNDETERMINED
RBC: 4.8
RDW: 14
WBC: 7.2

## 2011-07-24 LAB — CK: Total CK: 89

## 2011-07-24 LAB — BASIC METABOLIC PANEL
Calcium: 9
GFR calc non Af Amer: 50 — ABNORMAL LOW
Glucose, Bld: 105 — ABNORMAL HIGH
Sodium: 142

## 2016-07-24 DIAGNOSIS — S0501XA Injury of conjunctiva and corneal abrasion without foreign body, right eye, initial encounter: Secondary | ICD-10-CM | POA: Diagnosis not present

## 2016-07-25 DIAGNOSIS — S0501XD Injury of conjunctiva and corneal abrasion without foreign body, right eye, subsequent encounter: Secondary | ICD-10-CM | POA: Diagnosis not present

## 2016-08-27 ENCOUNTER — Encounter: Payer: Self-pay | Admitting: Internal Medicine

## 2016-08-27 ENCOUNTER — Ambulatory Visit (INDEPENDENT_AMBULATORY_CARE_PROVIDER_SITE_OTHER): Payer: Medicare Other | Admitting: Internal Medicine

## 2016-08-27 ENCOUNTER — Other Ambulatory Visit (INDEPENDENT_AMBULATORY_CARE_PROVIDER_SITE_OTHER): Payer: Medicare Other

## 2016-08-27 VITALS — BP 160/100 | HR 86 | Temp 98.8°F | Resp 16 | Ht 70.0 in | Wt 184.2 lb

## 2016-08-27 DIAGNOSIS — Z23 Encounter for immunization: Secondary | ICD-10-CM | POA: Diagnosis not present

## 2016-08-27 DIAGNOSIS — E785 Hyperlipidemia, unspecified: Secondary | ICD-10-CM

## 2016-08-27 DIAGNOSIS — M1711 Unilateral primary osteoarthritis, right knee: Secondary | ICD-10-CM | POA: Diagnosis not present

## 2016-08-27 DIAGNOSIS — R351 Nocturia: Secondary | ICD-10-CM

## 2016-08-27 DIAGNOSIS — N401 Enlarged prostate with lower urinary tract symptoms: Secondary | ICD-10-CM

## 2016-08-27 DIAGNOSIS — Z0000 Encounter for general adult medical examination without abnormal findings: Secondary | ICD-10-CM | POA: Diagnosis not present

## 2016-08-27 DIAGNOSIS — I10 Essential (primary) hypertension: Secondary | ICD-10-CM | POA: Diagnosis not present

## 2016-08-27 LAB — COMPREHENSIVE METABOLIC PANEL
ALT: 13 U/L (ref 0–53)
AST: 16 U/L (ref 0–37)
Albumin: 4.4 g/dL (ref 3.5–5.2)
Alkaline Phosphatase: 71 U/L (ref 39–117)
BUN: 18 mg/dL (ref 6–23)
CHLORIDE: 105 meq/L (ref 96–112)
CO2: 29 meq/L (ref 19–32)
CREATININE: 0.98 mg/dL (ref 0.40–1.50)
Calcium: 9.6 mg/dL (ref 8.4–10.5)
GFR: 79.45 mL/min (ref >60.00)
Glucose, Bld: 94 mg/dL (ref 70–99)
Potassium: 4 mEq/L (ref 3.5–5.1)
SODIUM: 141 meq/L (ref 135–145)
Total Bilirubin: 0.6 mg/dL (ref 0.2–1.2)
Total Protein: 6.7 g/dL (ref 6.0–8.3)

## 2016-08-27 LAB — CBC WITH DIFFERENTIAL/PLATELET
BASOS PCT: 0.3 % (ref 0.0–3.0)
Basophils Absolute: 0 10*3/uL (ref 0.0–0.1)
EOS ABS: 0.2 10*3/uL (ref 0.0–0.7)
Eosinophils Relative: 2.8 % (ref 0.0–5.0)
HEMATOCRIT: 44.5 % (ref 39.0–52.0)
Hemoglobin: 15.1 g/dL (ref 13.0–17.0)
Lymphocytes Relative: 19 % (ref 12.0–46.0)
Lymphs Abs: 1.7 10*3/uL (ref 0.7–4.0)
MCHC: 33.9 g/dL (ref 30.0–36.0)
MCV: 91.5 fl (ref 78.0–100.0)
MONO ABS: 0.9 10*3/uL (ref 0.1–1.0)
Monocytes Relative: 9.9 % (ref 3.0–12.0)
NEUTROS ABS: 6 10*3/uL (ref 1.4–7.7)
Neutrophils Relative %: 68 % (ref 43.0–77.0)
PLATELETS: 189 10*3/uL (ref 150.0–400.0)
RBC: 4.87 Mil/uL (ref 4.22–5.81)
RDW: 14.3 % (ref 11.5–15.5)
WBC: 8.8 10*3/uL (ref 4.0–10.5)

## 2016-08-27 LAB — URINALYSIS, ROUTINE W REFLEX MICROSCOPIC
Bilirubin Urine: NEGATIVE
Hgb urine dipstick: NEGATIVE
Ketones, ur: NEGATIVE
LEUKOCYTES UA: NEGATIVE
Nitrite: NEGATIVE
PH: 5.5 (ref 5.0–8.0)
RBC / HPF: NONE SEEN (ref 0–?)
Specific Gravity, Urine: >=1.03 — AB (ref 1.000–1.030)
TOTAL PROTEIN, URINE-UPE24: NEGATIVE
URINE GLUCOSE: NEGATIVE
UROBILINOGEN UA: 0.2 (ref 0.0–1.0)

## 2016-08-27 LAB — LIPID PANEL
CHOL/HDL RATIO: 4
Cholesterol: 166 mg/dL (ref 0–200)
HDL: 44.1 mg/dL (ref >39.00)
LDL CALC: 97 mg/dL (ref 0–99)
NonHDL: 121.76
Triglycerides: 122 mg/dL (ref 0.0–149.0)
VLDL: 24.4 mg/dL (ref 0.0–40.0)

## 2016-08-27 LAB — PSA: PSA: 0.86 ng/mL (ref 0.10–4.00)

## 2016-08-27 LAB — TSH: TSH: 1.17 u[IU]/mL (ref 0.35–4.50)

## 2016-08-27 MED ORDER — ZOSTER VACCINE LIVE 19400 UNT/0.65ML ~~LOC~~ SUSR: 0.6500 mL | Freq: Once | SUBCUTANEOUS | 0 refills | Status: AC

## 2016-08-27 MED ORDER — CHLORTHALIDONE 25 MG PO TABS: 25.0000 mg | ORAL_TABLET | Freq: Every day | ORAL | 3 refills | Status: DC

## 2016-08-27 NOTE — Patient Instructions (Signed)

## 2016-08-27 NOTE — Progress Notes (Signed)
Pre visit review using our clinic review tool, if applicable. No additional management support is needed unless otherwise documented below in the visit note. 

## 2016-08-27 NOTE — Progress Notes (Signed)
Subjective:  Patient ID: Jose Gamble, male    DOB: [birth year]  Age: 75 y.o. MRN: [id number]  CC: Hypertension; Hyperlipidemia; and Annual Exam   HPI Jose Gamble presents for CPX/AWV.  He has a history of high blood pressure but has not treated it in many years. Fortunately, he feels well and offers no complaints. He denies any recent episodes of headache/blurred vision/chest pain/shortness of breath/palpitations/edema/or fatigue.   Past Medical History:  Diagnosis Date  . Hypertension    History reviewed. No pertinent surgical history.  reports that he has never smoked. He has never used smokeless tobacco. He reports that he does not drink alcohol or use drugs. family history includes Alcohol abuse in his brother; Cancer (age of onset: 70) in his brother; Cancer (age of onset: 50) in his father; Early death in his brother. Allergies  Allergen Reactions  . Diovan [Valsartan] Other (See Comments)    Severe fatigue        No outpatient prescriptions prior to visit.   No facility-administered medications prior to visit.     ROS Review of Systems  Constitutional: Negative for activity change, appetite change, diaphoresis, fatigue and fever.  HENT: Negative.   Eyes: Negative for photophobia and visual disturbance.  Respiratory: Negative for cough, choking, chest tightness, shortness of breath and stridor.   Cardiovascular: Negative.  Negative for chest pain, palpitations and leg swelling.  Gastrointestinal: Negative.  Negative for abdominal pain, blood in stool, constipation, diarrhea, nausea and vomiting.  Endocrine: Negative.   Genitourinary: Positive for difficulty urinating. Negative for discharge, dysuria, flank pain, frequency, hematuria, penile pain, penile swelling, scrotal swelling, testicular pain and urgency.       He complains of nocturia, 3-4 times per night  Musculoskeletal: Positive for arthralgias. Negative for back pain, myalgias and neck pain.       He has  chronic, worsening right knee pain and instability  Skin: Negative.  Negative for color change, pallor and rash.  Allergic/Immunologic: Negative.   Neurological: Negative.  Negative for dizziness, tremors, syncope, speech difficulty, weakness, light-headedness, numbness and headaches.  Hematological: Negative.  Negative for adenopathy. Does not bruise/bleed easily.  Psychiatric/Behavioral: Negative.     Objective:  BP (!) 160/100 (BP Location: Left Arm, Patient Position: Sitting, Cuff Size: Normal)   Pulse 86   Temp 98.8 F (37.1 C) (Oral)   Resp 16   Ht 5\' 10"  (1.778 m)   Wt 184 lb 4 oz (83.6 kg)   SpO2 98%   BMI 26.44 kg/m   BP Readings from Last 3 Encounters:  08/27/16 (!) 160/100  10/04/10 136/82  05/20/10 118/70    Wt Readings from Last 3 Encounters:  08/27/16 184 lb 4 oz (83.6 kg)  10/04/10 197 lb 2.1 oz (89.4 kg)  05/20/10 199 lb (90.3 kg)    Physical Exam  Constitutional: He is oriented to person, place, and time. No distress.  HENT:  Mouth/Throat: Oropharynx is clear and moist. No oropharyngeal exudate.  Eyes: Conjunctivae are normal. Right eye exhibits no discharge. Left eye exhibits no discharge. No scleral icterus.  Neck: Normal range of motion. Neck supple. No JVD present. No tracheal deviation present. No thyromegaly present.  Cardiovascular: Normal rate, regular rhythm, normal heart sounds and intact distal pulses.  Exam reveals no gallop and no friction rub.   No murmur heard. Pulses:      Carotid pulses are 1+ on the right side, and 1+ on the left side.      Radial  pulses are 1+ on the right side, and 1+ on the left side.       Femoral pulses are 1+ on the right side, and 1+ on the left side.      Popliteal pulses are 1+ on the right side, and 1+ on the left side.       Dorsalis pedis pulses are 1+ on the right side, and 1+ on the left side.       Posterior tibial pulses are 1+ on the right side, and 1+ on the left side.  EKG ----  Atrial  Rhythm    P:QRS - 1:1, Abnormal P axis, H Rate 63 WITHIN NORMAL LIMITS   Pulmonary/Chest: Effort normal and breath sounds normal. No stridor. No respiratory distress. He has no wheezes. He has no rales. He exhibits no tenderness.  Abdominal: Soft. Bowel sounds are normal. He exhibits no distension and no mass. There is no tenderness. There is no rebound and no guarding. Hernia confirmed negative in the right inguinal area and confirmed negative in the left inguinal area.  Genitourinary: Rectum normal, testes normal and penis normal. Rectal exam shows no external hemorrhoid, no internal hemorrhoid, no fissure, no mass, no tenderness, anal tone normal and guaiac negative stool. Prostate is enlarged (2+ smooth symm BPH). Prostate is not tender. Right testis shows no mass, no swelling and no tenderness. Right testis is descended. Left testis shows no mass, no swelling and no tenderness. Left testis is descended. Uncircumcised. No phimosis, paraphimosis, hypospadias, penile erythema or penile tenderness. No discharge found.  Musculoskeletal: Normal range of motion. He exhibits no edema or tenderness.       Right knee: He exhibits deformity. He exhibits normal range of motion and no swelling. No tenderness found.  Right knee reveals valgus deformity, crepitance, and DJD  Lymphadenopathy:    He has no cervical adenopathy.       Right: No inguinal adenopathy present.       Left: No inguinal adenopathy present.  Neurological: He is oriented to person, place, and time.  Skin: Skin is warm and dry. No rash noted. He is not diaphoretic. No erythema. No pallor.  Psychiatric: He has a normal mood and affect. His behavior is normal. Judgment and thought content normal.  Vitals reviewed.   Lab Results  Component Value Date   WBC 8.8 08/27/2016   HGB 15.1 08/27/2016   HCT 44.5 08/27/2016   PLT 189.0 08/27/2016   GLUCOSE 94 08/27/2016   CHOL 166 08/27/2016   TRIG 122.0 08/27/2016   HDL 44.10 08/27/2016   LDLCALC  97 08/27/2016   ALT 13 08/27/2016   AST 16 08/27/2016   NA 141 08/27/2016   K 4.0 08/27/2016   CL 105 08/27/2016   CREATININE 0.98 08/27/2016   BUN 18 08/27/2016   CO2 29 08/27/2016   TSH 1.17 08/27/2016   PSA 0.86 08/27/2016   HGBA1C 5.8 03/07/2010    No results found.  Assessment & Plan:   Will was seen today for hypertension, hyperlipidemia and annual exam.  Diagnoses and all orders for this visit:  Essential hypertension- his blood pressure is not adequately well controlled, his EKG is negative for left ventricular hypertrophy or signs of ischemia, his labs are negative for any evidence of secondary causes or end organ damage, he did not tolerate an ARB last time it was prescribed so will try to control his blood pressure with a thiazide diuretic. -     Comprehensive metabolic panel; Future -  CBC with Differential/Platelet; Future -     TSH; Future -     Urinalysis, Routine w reflex microscopic (not at [facility]); Future -     chlorthalidone (HYGROTON) 25 MG tablet; Take 1 tablet (25 mg total) by mouth daily. -     EKG 12-Lead  BPH associated with nocturia- his PSA is normal so I'm not concerned about prostate cancer, he does have some symptoms related to this but he does not want to treat them at this time -     PSA; Future -     Urinalysis, Routine w reflex microscopic (not at [facility]); Future  Hyperlipidemia with target LDL less than 100- his Framingham risk score is 20% so I have asked to take an aspirin a day as well as starting statin therapy to reduce his risk for cardiovascular events -     Lipid panel; Future -     TSH; Future  Routine general medical examination at a health care facility -     Zoster Vaccine Live, PF, (ZOSTAVAX) 16109 UNT/0.65ML injection; Inject 19,400 Units into the skin once.  Primary osteoarthritis of right knee -     Ambulatory referral to Orthopedic Surgery  Need for prophylactic vaccination against Streptococcus pneumoniae (pneumococcus) -      Pneumococcal conjugate vaccine 13-valent  Need for prophylactic vaccination and inoculation against influenza -     Flu vaccine HIGH DOSE PF (Fluzone High dose)   I have discontinued Mr. Beggs tobramycin-dexamethasone. I am also having him start on Zoster Vaccine Live (PF), chlorthalidone, and atorvastatin. Additionally, I am having him maintain his aspirin EC.  Meds ordered this encounter  Medications  . aspirin EC 81 MG tablet    Sig: Take 81 mg by mouth.  . DISCONTD: tobramycin-dexamethasone (TOBRADEX) ophthalmic solution  . Zoster Vaccine Live, PF, (ZOSTAVAX) 60454 UNT/0.65ML injection    Sig: Inject 19,400 Units into the skin once.    Dispense:  1 each    Refill:  0  . chlorthalidone (HYGROTON) 25 MG tablet    Sig: Take 1 tablet (25 mg total) by mouth daily.    Dispense:  30 tablet    Refill:  3  . atorvastatin (LIPITOR) 10 MG tablet    Sig: Take 1 tablet (10 mg total) by mouth daily.    Dispense:  90 tablet    Refill:  3   See AVS for instructions about healthy living and anticipatory guidance.  Follow-up: Return in about 6 weeks (around 10/08/2016).  Scarlette Calico, MD

## 2016-08-28 MED ORDER — ATORVASTATIN CALCIUM 10 MG PO TABS: 10.0000 mg | ORAL_TABLET | Freq: Every day | ORAL | 3 refills | Status: DC

## 2016-08-28 NOTE — Assessment & Plan Note (Signed)

## 2016-09-02 ENCOUNTER — Telehealth: Payer: Self-pay

## 2016-09-02 NOTE — Telephone Encounter (Signed)
Order [id number]

## 2016-09-15 DIAGNOSIS — H5211 Myopia, right eye: Secondary | ICD-10-CM | POA: Diagnosis not present

## 2016-09-16 ENCOUNTER — Encounter: Payer: Self-pay | Admitting: Internal Medicine

## 2016-09-22 LAB — COLOGUARD: COLOGUARD: NEGATIVE

## 2016-10-08 ENCOUNTER — Ambulatory Visit (INDEPENDENT_AMBULATORY_CARE_PROVIDER_SITE_OTHER): Payer: BC Managed Care – PPO | Admitting: Internal Medicine

## 2016-10-08 ENCOUNTER — Encounter: Payer: Self-pay | Admitting: Internal Medicine

## 2016-10-08 VITALS — BP 152/82 | HR 72 | Temp 98.1°F | Resp 16 | Ht 70.0 in | Wt 183.5 lb

## 2016-10-08 DIAGNOSIS — E785 Hyperlipidemia, unspecified: Secondary | ICD-10-CM

## 2016-10-08 DIAGNOSIS — I10 Essential (primary) hypertension: Secondary | ICD-10-CM | POA: Diagnosis not present

## 2016-10-08 MED ORDER — CHLORTHALIDONE 25 MG PO TABS: 25.0000 mg | ORAL_TABLET | Freq: Every day | ORAL | 1 refills | Status: DC

## 2016-10-08 NOTE — Progress Notes (Signed)
Subjective:  Patient ID: Jose Gamble, male    DOB: 1941-01-29  Age: 75 y.o. MRN: [id number]  CC: Hypertension and Hyperlipidemia   HPI Jose Gamble presents for follow-up on hypertension and high cholesterol. When I last saw him his blood pressure was 160/100 and he was started on a thiazide diuretic. He tells me his blood pressure has been normal at home and hehas felt well. He denies any episodes of dizziness, lightheadedness, or palpitations. He is tolerating Lipitor well with no muscle or joint aches.  Outpatient Medications Prior to Visit  Medication Sig Dispense Refill  . aspirin EC 81 MG tablet Take 81 mg by mouth.    Marland Kitchen atorvastatin (LIPITOR) 10 MG tablet Take 1 tablet (10 mg total) by mouth daily. 90 tablet 3  . chlorthalidone (HYGROTON) 25 MG tablet Take 1 tablet (25 mg total) by mouth daily. 30 tablet 3   No facility-administered medications prior to visit.     ROS Review of Systems  Constitutional: Negative for appetite change, diaphoresis and fatigue.  HENT: Negative.   Eyes: Negative for visual disturbance.  Respiratory: Negative.  Negative for cough, chest tightness, shortness of breath and stridor.   Cardiovascular: Negative.  Negative for chest pain, palpitations and leg swelling.  Gastrointestinal: Negative for abdominal pain, constipation, diarrhea, nausea and vomiting.  Genitourinary: Negative.  Negative for difficulty urinating, dysuria and frequency.  Musculoskeletal: Negative.  Negative for arthralgias, back pain, joint swelling and myalgias.  Skin: Negative.   Allergic/Immunologic: Negative.   Neurological: Negative.  Negative for dizziness, weakness, light-headedness and headaches.  Hematological: Negative.  Negative for adenopathy. Does not bruise/bleed easily.  Psychiatric/Behavioral: Negative.     Objective:  BP (!) 152/82 (BP Location: Left Arm, Patient Position: Sitting, Cuff Size: Normal)   Pulse 72   Temp 98.1 F (36.7 C) (Oral)   Resp 16   Ht  5\' 10"  (1.778 m)   Wt 183 lb 8 oz (83.2 kg)   SpO2 96%   BMI 26.33 kg/m   BP Readings from Last 3 Encounters:  10/08/16 (!) 152/82  08/27/16 (!) 160/100  10/04/10 136/82    Wt Readings from Last 3 Encounters:  10/08/16 183 lb 8 oz (83.2 kg)  08/27/16 184 lb 4 oz (83.6 kg)  10/04/10 197 lb 2.1 oz (89.4 kg)    Physical Exam  Constitutional: He is oriented to person, place, and time. No distress.  HENT:  Head: Normocephalic and atraumatic.  Mouth/Throat: Oropharynx is clear and moist. No oropharyngeal exudate.  Eyes: Conjunctivae are normal. Right eye exhibits no discharge. Left eye exhibits no discharge. No scleral icterus.  Neck: Neck supple. No JVD present. No tracheal deviation present. No thyromegaly present.  Cardiovascular: Normal rate, regular rhythm, normal heart sounds and intact distal pulses.  Exam reveals no gallop and no friction rub.   No murmur heard. Pulmonary/Chest: Effort normal and breath sounds normal. No stridor. No respiratory distress. He has no wheezes. He has no rales. He exhibits no tenderness.  Abdominal: Soft. Bowel sounds are normal. He exhibits no distension and no mass. There is no tenderness. There is no rebound and no guarding.  Musculoskeletal: Normal range of motion. He exhibits no edema, tenderness or deformity.  Lymphadenopathy:    He has no cervical adenopathy.  Neurological: He is oriented to person, place, and time.  Skin: Skin is warm and dry. No rash noted. He is not diaphoretic. No erythema. No pallor.  Vitals reviewed.   Lab Results  Component  Value Date   WBC 8.8 08/27/2016   HGB 15.1 08/27/2016   HCT 44.5 08/27/2016   PLT 189.0 08/27/2016   GLUCOSE 94 08/27/2016   CHOL 166 08/27/2016   TRIG 122.0 08/27/2016   HDL 44.10 08/27/2016   LDLCALC 97 08/27/2016   ALT 13 08/27/2016   AST 16 08/27/2016   NA 141 08/27/2016   K 4.0 08/27/2016   CL 105 08/27/2016   CREATININE 0.98 08/27/2016   BUN 18 08/27/2016   CO2 29 08/27/2016     TSH 1.17 08/27/2016   PSA 0.86 08/27/2016   HGBA1C 5.8 03/07/2010    No results found.  Assessment & Plan:   Jose Gamble was seen today for hypertension and hyperlipidemia.  Diagnoses and all orders for this visit:  Essential hypertension - his blood pressure is well controlled, will continue the thiazide diuretic at the current dose. -     chlorthalidone (HYGROTON) 25 MG tablet; Take 1 tablet (25 mg total) by mouth daily.  Hyperlipidemia with target LDL less than 100- will continue the statin and aspirin therapy for cardiovascular risk reduction.   I am having Jose Gamble maintain his aspirin EC, atorvastatin, and chlorthalidone.  Meds ordered this encounter  Medications  . chlorthalidone (HYGROTON) 25 MG tablet    Sig: Take 1 tablet (25 mg total) by mouth daily.    Dispense:  90 tablet    Refill:  1     Follow-up: Return in about 6 months (around 04/08/2017).  Scarlette Calico, MD

## 2016-10-08 NOTE — Patient Instructions (Signed)
Hypertension Hypertension, commonly called high blood pressure, is when the force of blood pumping through your arteries is too strong. Your arteries are the blood vessels that carry blood from your heart throughout your body. A blood pressure reading consists of a higher number over a lower number, such as 110/72. The higher number (systolic) is the pressure inside your arteries when your heart pumps. The lower number (diastolic) is the pressure inside your arteries when your heart relaxes. Ideally you want your blood pressure below 120/80. Hypertension forces your heart to work harder to pump blood. Your arteries may become narrow or stiff. Having untreated or uncontrolled hypertension can cause heart attack, stroke, kidney disease, and other problems. What increases the risk? Some risk factors for high blood pressure are controllable. Others are not. Risk factors you cannot control include:  Race. You may be at higher risk if you are African American.  Age. Risk increases with age.  Gender. Men are at higher risk than women before age 45 years. After age 65, women are at higher risk than men. Risk factors you can control include:  Not getting enough exercise or physical activity.  Being overweight.  Getting too much fat, sugar, calories, or salt in your diet.  Drinking too much alcohol. What are the signs or symptoms? Hypertension does not usually cause signs or symptoms. Extremely high blood pressure (hypertensive crisis) may cause headache, anxiety, shortness of breath, and nosebleed. How is this diagnosed? To check if you have hypertension, your health care provider will measure your blood pressure while you are seated, with your arm held at the level of your heart. It should be measured at least twice using the same arm. Certain conditions can cause a difference in blood pressure between your right and left arms. A blood pressure reading that is higher than normal on one occasion does  not mean that you need treatment. If it is not clear whether you have high blood pressure, you may be asked to return on a different day to have your blood pressure checked again. Or, you may be asked to monitor your blood pressure at home for 1 or more weeks. How is this treated? Treating high blood pressure includes making lifestyle changes and possibly taking medicine. Living a healthy lifestyle can help lower high blood pressure. You may need to change some of your habits. Lifestyle changes may include:  Following the DASH diet. This diet is high in fruits, vegetables, and whole grains. It is low in salt, red meat, and added sugars.  Keep your sodium intake below 2,300 mg per day.  Getting at least 30-45 minutes of aerobic exercise at least 4 times per week.  Losing weight if necessary.  Not smoking.  Limiting alcoholic beverages.  Learning ways to reduce stress. Your health care provider may prescribe medicine if lifestyle changes are not enough to get your blood pressure under control, and if one of the following is true:  You are 18-59 years of age and your systolic blood pressure is above 140.  You are 60 years of age or older, and your systolic blood pressure is above 150.  Your diastolic blood pressure is above 90.  You have diabetes, and your systolic blood pressure is over 140 or your diastolic blood pressure is over 90.  You have kidney disease and your blood pressure is above 140/90.  You have heart disease and your blood pressure is above 140/90. Your personal target blood pressure may vary depending on your medical   conditions, your age, and other factors. Follow these instructions at home:  Have your blood pressure rechecked as directed by your health care provider.  Take medicines only as directed by your health care provider. Follow the directions carefully. Blood pressure medicines must be taken as prescribed. The medicine does not work as well when you skip  doses. Skipping doses also puts you at risk for problems.  Do not smoke.  Monitor your blood pressure at home as directed by your health care provider. Contact a health care provider if:  You think you are having a reaction to medicines taken.  You have recurrent headaches or feel dizzy.  You have swelling in your ankles.  You have trouble with your vision. Get help right away if:  You develop a severe headache or confusion.  You have unusual weakness, numbness, or feel faint.  You have severe chest or abdominal pain.  You vomit repeatedly.  You have trouble breathing. This information is not intended to replace advice given to you by your health care provider. Make sure you discuss any questions you have with your health care provider. Document Released: 10/13/2005 Document Revised: 03/20/2016 Document Reviewed: 08/05/2013 Elsevier Interactive Patient Education  2017 [employer]  

## 2016-10-08 NOTE — Progress Notes (Signed)
Pre visit review using our clinic review tool, if applicable. No additional management support is needed unless otherwise documented below in the visit note. 

## 2016-10-17 ENCOUNTER — Encounter: Payer: Self-pay | Admitting: Internal Medicine

## 2016-10-17 NOTE — Progress Notes (Signed)
Abstracted result and sent to scan  

## 2016-10-27 HISTORY — PX: CATARACT EXTRACTION, BILATERAL: SHX1313

## 2016-11-10 DIAGNOSIS — M25561 Pain in right knee: Secondary | ICD-10-CM | POA: Diagnosis not present

## 2016-11-10 DIAGNOSIS — M170 Bilateral primary osteoarthritis of knee: Secondary | ICD-10-CM | POA: Diagnosis not present

## 2016-11-10 DIAGNOSIS — G8929 Other chronic pain: Secondary | ICD-10-CM | POA: Diagnosis not present

## 2016-11-10 DIAGNOSIS — M25562 Pain in left knee: Secondary | ICD-10-CM | POA: Diagnosis not present

## 2016-11-19 DIAGNOSIS — H2513 Age-related nuclear cataract, bilateral: Secondary | ICD-10-CM | POA: Diagnosis not present

## 2016-11-26 DIAGNOSIS — H2512 Age-related nuclear cataract, left eye: Secondary | ICD-10-CM | POA: Diagnosis not present

## 2016-11-26 DIAGNOSIS — H5703 Miosis: Secondary | ICD-10-CM | POA: Diagnosis not present

## 2016-11-26 DIAGNOSIS — H2511 Age-related nuclear cataract, right eye: Secondary | ICD-10-CM | POA: Diagnosis not present

## 2016-11-27 DIAGNOSIS — M1711 Unilateral primary osteoarthritis, right knee: Secondary | ICD-10-CM | POA: Diagnosis not present

## 2016-12-03 DIAGNOSIS — H2512 Age-related nuclear cataract, left eye: Secondary | ICD-10-CM | POA: Diagnosis not present

## 2016-12-04 NOTE — Telephone Encounter (Signed)
Abstraction verified.

## 2016-12-09 NOTE — Patient Instructions (Signed)
Jose Gamble  12/09/2016   Your procedure is scheduled on: 12/22/2016    Report to [facility] Main  Entrance take [place]  elevators to 3rd floor to  [place] at   [place]   AM.  Call this number if you have problems the morning of surgery [phone]   Remember: ONLY 1 PERSON MAY GO WITH YOU TO SHORT STAY TO GET  READY MORNING OF [name].  Do not eat food or drink liquids :After Midnight.     Take these medicines the morning of surgery with A SIP OF WATER: eye drops as usual                                 You may not have any metal on your body including hair pins and              piercings  Do not wear jewelry,  lotions, powders or perfumes, deodorant                           Men may shave face and neck.   Do not bring valuables to the hospital. [place].  Contacts, dentures or bridgework may not be worn into surgery.  Leave suitcase in the car. After surgery it may be brought to your room.                      Please read over the following fact sheets you were given: _____________________________________________________________________             [facility] - Preparing for Surgery Before surgery, you can play an important role.  Because skin is not sterile, your skin needs to be as free of germs as possible.  You can reduce the number of germs on your skin by washing with CHG (chlorahexidine gluconate) soap before surgery.  CHG is an antiseptic cleaner which kills germs and bonds with the skin to continue killing germs even after washing. Please DO NOT use if you have an allergy to CHG or antibacterial soaps.  If your skin becomes reddened/irritated stop using the CHG and inform your nurse when you arrive at Short Stay. Do not shave (including legs and underarms) for at least 48 hours prior to the first CHG shower.  You may shave your face/neck. Please follow these instructions  carefully:  1.  Shower with CHG Soap the night before surgery and the  morning of Surgery.  2.  If you choose to wash your hair, wash your hair first as usual with your  normal  shampoo.  3.  After you shampoo, rinse your hair and body thoroughly to remove the  shampoo.                           4.  Use CHG as you would any other liquid soap.  You can apply chg directly  to the skin and wash                       Gently with a scrungie or clean washcloth.  5.  Apply the CHG Soap  to your body ONLY FROM THE NECK DOWN.   Do not use on face/ open                           Wound or open sores. Avoid contact with eyes, ears mouth and genitals (private parts).                       Wash face,  Genitals (private parts) with your normal soap.             6.  Wash thoroughly, paying special attention to the area where your surgery  will be performed.  7.  Thoroughly rinse your body with warm water from the neck down.  8.  DO NOT shower/wash with your normal soap after using and rinsing off  the CHG Soap.                9.  Pat yourself dry with a clean towel.            10.  Wear clean pajamas.            11.  Place clean sheets on your bed the night of your first shower and do not  sleep with pets. Day of Surgery : Do not apply any lotions/deodorants the morning of surgery.  Please wear clean clothes to the hospital/surgery center.  FAILURE TO FOLLOW THESE INSTRUCTIONS MAY RESULT IN THE CANCELLATION OF YOUR SURGERY PATIENT SIGNATURE_________________________________  NURSE SIGNATURE__________________________________  ________________________________________________________________________  WHAT IS A BLOOD TRANSFUSION? Blood Transfusion Information  A transfusion is the replacement of blood or some of its parts. Blood is made up of multiple cells which provide different functions.  Red blood cells carry oxygen and are used for blood loss replacement.  White blood cells fight against  infection.  Platelets control bleeding.  Plasma helps clot blood.  Other blood products are available for specialized needs, such as hemophilia or other clotting disorders. BEFORE THE TRANSFUSION  Who gives blood for transfusions?   Healthy volunteers who are fully evaluated to make sure their blood is safe. This is blood bank blood. Transfusion therapy is the safest it has ever been in the practice of medicine. Before blood is taken from a donor, a complete history is taken to make sure that person has no history of diseases nor engages in risky social behavior (examples are intravenous drug use or sexual activity with multiple partners). The donor's travel history is screened to minimize risk of transmitting infections, such as malaria. The donated blood is tested for signs of infectious diseases, such as HIV and hepatitis. The blood is then tested to be sure it is compatible with you in order to minimize the chance of a transfusion reaction. If you or a relative donates blood, this is often done in anticipation of surgery and is not appropriate for emergency situations. It takes many days to process the donated blood. RISKS AND COMPLICATIONS Although transfusion therapy is very safe and saves many lives, the main dangers of transfusion include:   Getting an infectious disease.  Developing a transfusion reaction. This is an allergic reaction to something in the blood you were given. Every precaution is taken to prevent this. The decision to have a blood transfusion has been considered carefully by your caregiver before blood is given. Blood is not given unless the benefits outweigh the risks. AFTER THE TRANSFUSION  Right after receiving a blood transfusion, you will  usually feel much better and more energetic. This is especially true if your red blood cells have gotten low (anemic). The transfusion raises the level of the red blood cells which carry oxygen, and this usually causes an energy  increase.  The nurse administering the transfusion will monitor you carefully for complications. HOME CARE INSTRUCTIONS  No special instructions are needed after a transfusion. You may find your energy is better. Speak with your caregiver about any limitations on activity for underlying diseases you may have. SEEK MEDICAL CARE IF:   Your condition is not improving after your transfusion.  You develop redness or irritation at the intravenous (IV) site. SEEK IMMEDIATE MEDICAL CARE IF:  Any of the following symptoms occur over the next 12 hours:  Shaking chills.  You have a temperature by mouth above 102 F (38.9 C), not controlled by medicine.  Chest, back, or muscle pain.  People around you feel you are not acting correctly or are confused.  Shortness of breath or difficulty breathing.  Dizziness and fainting.  You get a rash or develop hives.  You have a decrease in urine output.  Your urine turns a dark color or changes to pink, red, or brown. Any of the following symptoms occur over the next 10 days:  You have a temperature by mouth above 102 F (38.9 C), not controlled by medicine.  Shortness of breath.  Weakness after normal activity.  The white part of the eye turns yellow (jaundice).  You have a decrease in the amount of urine or are urinating less often.  Your urine turns a dark color or changes to pink, red, or brown. Document Released: 10/10/2000 Document Revised: 01/05/2012 Document Reviewed: 05/29/2008 ExitCare Patient Information 2014 [place].  _______________________________________________________________________  Incentive Spirometer  An incentive spirometer is a tool that can help keep your lungs clear and active. This tool measures how well you are filling your lungs with each breath. Taking long deep breaths may help reverse or decrease the chance of developing breathing (pulmonary) problems (especially infection) following:  A long  period of time when you are unable to move or be active. BEFORE THE PROCEDURE   If the spirometer includes an indicator to show your best effort, your nurse or respiratory therapist will set it to a desired goal.  If possible, sit up straight or lean slightly forward. Try not to slouch.  Hold the incentive spirometer in an upright position. INSTRUCTIONS FOR USE  1. Sit on the edge of your bed if possible, or sit up as far as you can in bed or on a chair. 2. Hold the incentive spirometer in an upright position. 3. Breathe out normally. 4. Place the mouthpiece in your mouth and seal your lips tightly around it. 5. Breathe in slowly and as deeply as possible, raising the piston or the ball toward the top of the column. 6. Hold your breath for 3-5 seconds or for as long as possible. Allow the piston or ball to fall to the bottom of the column. 7. Remove the mouthpiece from your mouth and breathe out normally. 8. Rest for a few seconds and repeat Steps 1 through 7 at least 10 times every 1-2 hours when you are awake. Take your time and take a few normal breaths between deep breaths. 9. The spirometer may include an indicator to show your best effort. Use the indicator as a goal to work toward during each repetition. 10. After each set of 10 deep breaths, practice coughing  to be sure your lungs are clear. If you have an incision (the cut made at the time of surgery), support your incision when coughing by placing a pillow or rolled up towels firmly against it. Once you are able to get out of bed, walk around indoors and cough well. You may stop using the incentive spirometer when instructed by your caregiver.  RISKS AND COMPLICATIONS  Take your time so you do not get dizzy or light-headed.  If you are in pain, you may need to take or ask for pain medication before doing incentive spirometry. It is harder to take a deep breath if you are having pain. AFTER USE  Rest and breathe slowly and  easily.  It can be helpful to keep track of a log of your progress. Your caregiver can provide you with a simple table to help with this. If you are using the spirometer at home, follow these instructions: [place] IF:   You are having difficultly using the spirometer.  You have trouble using the spirometer as often as instructed.  Your pain medication is not giving enough relief while using the spirometer.  You develop fever of 100.5 F (38.1 C) or higher. SEEK IMMEDIATE MEDICAL CARE IF:   You cough up bloody sputum that had not been present before.  You develop fever of 102 F (38.9 C) or greater.  You develop worsening pain at or near the incision site. MAKE SURE YOU:   Understand these instructions.  Will watch your condition.  Will get help right away if you are not doing well or get worse. Document Released: 02/23/2007 Document Revised: 01/05/2012 Document Reviewed: 04/26/2007 [facility] Patient Information 2014 [place], [place].   ________________________________________________________________________

## 2016-12-11 ENCOUNTER — Encounter (HOSPITAL_COMMUNITY)
Admission: RE | Admit: 2016-12-11 | Discharge: 2016-12-11 | Disposition: A | Discharge status: Home / Self Care | Payer: Medicare Other | Source: Ambulatory Visit | Attending: Orthopedic Surgery | Admitting: Orthopedic Surgery

## 2016-12-11 ENCOUNTER — Encounter (HOSPITAL_COMMUNITY): Payer: Self-pay

## 2016-12-11 DIAGNOSIS — M1711 Unilateral primary osteoarthritis, right knee: Secondary | ICD-10-CM | POA: Diagnosis not present

## 2016-12-11 DIAGNOSIS — M25561 Pain in right knee: Secondary | ICD-10-CM | POA: Insufficient documentation

## 2016-12-11 DIAGNOSIS — Z01812 Encounter for preprocedural laboratory examination: Secondary | ICD-10-CM | POA: Insufficient documentation

## 2016-12-11 HISTORY — DX: Unspecified osteoarthritis, unspecified site: M19.90

## 2016-12-11 HISTORY — DX: Personal history of urinary calculi: Z87.442

## 2016-12-11 LAB — BASIC METABOLIC PANEL
ANION GAP: 9 (ref 5–15)
BUN: 22 mg/dL — ABNORMAL HIGH (ref 6–20)
CALCIUM: 9.6 mg/dL (ref 8.9–10.3)
CO2: 27 mmol/L (ref 22–32)
Chloride: 106 mmol/L (ref 101–111)
Creatinine, Ser: 0.86 mg/dL (ref 0.61–1.24)
GLUCOSE: 127 mg/dL — AB (ref 65–99)
Potassium: 3.4 mmol/L — ABNORMAL LOW (ref 3.5–5.1)
Sodium: 142 mmol/L (ref 135–145)

## 2016-12-11 LAB — CBC
HCT: 43.8 % (ref 39.0–52.0)
Hemoglobin: 14.9 g/dL (ref 13.0–17.0)
MCH: 30.8 pg (ref 26.0–34.0)
MCHC: 34 g/dL (ref 30.0–36.0)
MCV: 90.5 fL (ref 78.0–100.0)
PLATELETS: 195 10*3/uL (ref 150–400)
RBC: 4.84 MIL/uL (ref 4.22–5.81)
RDW: 13.2 % (ref 11.5–15.5)
WBC: 7.2 10*3/uL (ref 4.0–10.5)

## 2016-12-11 LAB — TYPE AND SCREEN
ABO/RH(D): B POS
Antibody Screen: NEGATIVE

## 2016-12-11 LAB — SURGICAL PCR SCREEN
MRSA, PCR: NEGATIVE
STAPHYLOCOCCUS AUREUS: POSITIVE — AB

## 2016-12-11 LAB — ABO/RH: ABO/RH(D): B POS

## 2016-12-11 NOTE — Progress Notes (Signed)
EKG-08/27/16-epic

## 2016-12-15 NOTE — H&P (Signed)
TOTAL KNEE ADMISSION H&P  Patient is being admitted for right total knee arthroplasty.  Subjective:  Chief Complaint:    Right knee primary OA / pain  HPI: Jose Gamble, 76 y.o. male, has a history of pain and functional disability in the right knee due to arthritis and has failed non-surgical conservative treatments for greater than 12 weeks to include NSAID's and/or analgesics and activity modification.  Onset of symptoms was gradual, starting 1+ years ago with gradually worsening course since that time. The patient noted no past surgery on the right knee(s).  Patient currently rates pain in the right knee(s) at 8 out of 10 with activity. Patient has worsening of pain with activity and weight bearing, pain that interferes with activities of daily living, pain with passive range of motion, crepitus and joint swelling.  Patient has evidence of periarticular osteophytes and joint space narrowing by imaging studies.  There is no active infection.   Risks, benefits and expectations were discussed with the patient.  Risks including but not limited to the risk of anesthesia, blood clots, nerve damage, blood vessel damage, failure of the prosthesis, infection and up to and including death.  Patient understand the risks, benefits and expectations and wishes to proceed with surgery.   PCP: Scarlette Calico, MD  D/C Plans:       Home with HHPT  Post-op Meds:       No Rx given  Tranexamic Acid:      To be given - IV   Decadron:      Is to be given  FYI:     ASA  Norco  DME:  Rx given for 3-n-1. Already have a walker.    Patient Active Problem List   Diagnosis Date Noted  . Hyperlipidemia with target LDL less than 100 08/27/2016  . Routine general medical examination at a health care facility 08/27/2016  . Essential hypertension 03/07/2010  . OA (osteoarthritis) of knee 10/16/2008  . NEOPLASM, MALIGNANT, TONGUE 08/25/2008  . BPH associated with nocturia 08/25/2008   Past Medical History:   Diagnosis Date  . Arthritis   . History of kidney stones   . Hypertension     Past Surgical History:  Procedure Laterality Date  . [place] cyst removed from tongue     . left knee arthroscopy       No prescriptions prior to admission.   Allergies  Allergen Reactions  . Diovan [Valsartan] Other (See Comments)    Severe fatigue    Social History  Substance Use Topics  . Smoking status: Former Research scientist (life sciences)  . Smokeless tobacco: Former Systems developer  . Alcohol use No    Family History  Problem Relation Age of Onset  . Cancer Father 77    prostate  . Cancer Brother 81    prostate  . Alcohol abuse Brother   . Early death Brother   . Heart disease Neg Hx   . Hyperlipidemia Neg Hx   . Hypertension Neg Hx   . Stroke Neg Hx   . Diabetes Neg Hx   . Depression Neg Hx   . COPD Neg Hx   . Asthma Neg Hx      Review of Systems  Constitutional: Negative.   HENT: Negative.   Eyes: Negative.   Respiratory: Negative.   Cardiovascular: Negative.   Gastrointestinal: Negative.   Genitourinary: Negative.   Musculoskeletal: Positive for joint pain.  Skin: Negative.   Neurological: Negative.   Endo/Heme/Allergies: Negative.   Psychiatric/Behavioral: Negative.  Objective:  Physical Exam  Constitutional: He is oriented to person, place, and time. He appears well-developed.  HENT:  Head: Normocephalic.  Eyes: Pupils are equal, round, and reactive to light.  Neck: Neck supple. No JVD present. No tracheal deviation present. No thyromegaly present.  Cardiovascular: Normal rate, regular rhythm, normal heart sounds and intact distal pulses.   Respiratory: Effort normal and breath sounds normal. No respiratory distress. He has no wheezes.  GI: Soft. There is no tenderness. There is no guarding.  Musculoskeletal:       Right knee: He exhibits decreased range of motion, swelling, abnormal alignment and bony tenderness. He exhibits no ecchymosis, no deformity, no laceration and no erythema.  Tenderness found.  Lymphadenopathy:    He has no cervical adenopathy.  Neurological: He is alert and oriented to person, place, and time. A sensory deficit (occassionally in the right leg, question from the right knee) is present.  Skin: Skin is warm and dry.  Psychiatric: He has a normal mood and affect.      Labs:  Estimated body mass index is 26.11 kg/m as calculated from the following:   Height as of 12/11/16: 5\' 10"  (1.778 m).   Weight as of 12/11/16: 82.6 kg (182 lb).   Imaging Review Plain radiographs demonstrate severe degenerative joint disease of the right knee(s). The overall alignment issignificant valgus. The bone quality appears to be good for age and reported activity level.  Assessment/Plan:  End stage arthritis, right knee   The patient history, physical examination, clinical judgment of the provider and imaging studies are consistent with end stage degenerative joint disease of the right knee(s) and total knee arthroplasty is deemed medically necessary. The treatment options including medical management, injection therapy arthroscopy and arthroplasty were discussed at length. The risks and benefits of total knee arthroplasty were presented and reviewed. The risks due to aseptic loosening, infection, stiffness, patella tracking problems, thromboembolic complications and other imponderables were discussed. The patient acknowledged the explanation, agreed to proceed with the plan and consent was signed. Patient is being admitted for inpatient treatment for surgery, pain control, PT, OT, prophylactic antibiotics, VTE prophylaxis, progressive ambulation and ADL's and discharge planning. The patient is planning to be discharged home with home health services.     West Pugh Chandlar Staebell   PA-C  12/15/2016, 1:27 PM

## 2016-12-22 ENCOUNTER — Encounter (HOSPITAL_COMMUNITY)
Admission: RE | Disposition: A | Discharge status: Home / Self Care | Payer: Self-pay | Source: Ambulatory Visit | Attending: Orthopedic Surgery

## 2016-12-22 ENCOUNTER — Inpatient Hospital Stay (HOSPITAL_COMMUNITY): Payer: Medicare Other | Admitting: Anesthesiology

## 2016-12-22 ENCOUNTER — Encounter (HOSPITAL_COMMUNITY): Payer: Self-pay | Admitting: *Deleted

## 2016-12-22 ENCOUNTER — Observation Stay (HOSPITAL_COMMUNITY)
Admission: RE | Admit: 2016-12-22 | Discharge: 2016-12-23 | Disposition: A | Discharge status: Home / Self Care | Payer: Medicare Other | Source: Ambulatory Visit | Attending: Orthopedic Surgery | Admitting: Orthopedic Surgery

## 2016-12-22 DIAGNOSIS — Z7982 Long term (current) use of aspirin: Secondary | ICD-10-CM | POA: Diagnosis not present

## 2016-12-22 DIAGNOSIS — Z79899 Other long term (current) drug therapy: Secondary | ICD-10-CM | POA: Insufficient documentation

## 2016-12-22 DIAGNOSIS — Z87442 Personal history of urinary calculi: Secondary | ICD-10-CM | POA: Insufficient documentation

## 2016-12-22 DIAGNOSIS — Z811 Family history of alcohol abuse and dependence: Secondary | ICD-10-CM | POA: Insufficient documentation

## 2016-12-22 DIAGNOSIS — Z96651 Presence of right artificial knee joint: Secondary | ICD-10-CM

## 2016-12-22 DIAGNOSIS — G8918 Other acute postprocedural pain: Secondary | ICD-10-CM | POA: Diagnosis not present

## 2016-12-22 DIAGNOSIS — Z888 Allergy status to other drugs, medicaments and biological substances status: Secondary | ICD-10-CM | POA: Insufficient documentation

## 2016-12-22 DIAGNOSIS — R351 Nocturia: Secondary | ICD-10-CM | POA: Diagnosis not present

## 2016-12-22 DIAGNOSIS — N401 Enlarged prostate with lower urinary tract symptoms: Secondary | ICD-10-CM | POA: Insufficient documentation

## 2016-12-22 DIAGNOSIS — Z8489 Family history of other specified conditions: Secondary | ICD-10-CM | POA: Insufficient documentation

## 2016-12-22 DIAGNOSIS — M659 Synovitis and tenosynovitis, unspecified: Secondary | ICD-10-CM | POA: Diagnosis not present

## 2016-12-22 DIAGNOSIS — M25461 Effusion, right knee: Secondary | ICD-10-CM | POA: Diagnosis not present

## 2016-12-22 DIAGNOSIS — E785 Hyperlipidemia, unspecified: Secondary | ICD-10-CM | POA: Diagnosis not present

## 2016-12-22 DIAGNOSIS — I10 Essential (primary) hypertension: Secondary | ICD-10-CM | POA: Insufficient documentation

## 2016-12-22 DIAGNOSIS — Z85810 Personal history of malignant neoplasm of tongue: Secondary | ICD-10-CM | POA: Diagnosis not present

## 2016-12-22 DIAGNOSIS — M1711 Unilateral primary osteoarthritis, right knee: Secondary | ICD-10-CM | POA: Diagnosis not present

## 2016-12-22 HISTORY — PX: TOTAL KNEE ARTHROPLASTY: SHX125

## 2016-12-22 SURGERY — ARTHROPLASTY, KNEE, TOTAL
Anesthesia: Spinal | Site: Knee | Laterality: Right

## 2016-12-22 MED ORDER — POLYETHYLENE GLYCOL 3350 17 G PO PACK
17.0000 g | PACK | Freq: Two times a day (BID) | ORAL | Status: DC
  Administered 2016-12-22 – 2016-12-23 (×2): 17 g via ORAL
  Filled 2016-12-22 (×2): qty 1

## 2016-12-22 MED ORDER — HYDROMORPHONE HCL 2 MG/ML IJ SOLN: 0.5000 mg | INTRAMUSCULAR | Status: DC | PRN

## 2016-12-22 MED ORDER — ONDANSETRON HCL 4 MG/2ML IJ SOLN
INTRAMUSCULAR | Status: AC
  Filled 2016-12-22: qty 2

## 2016-12-22 MED ORDER — PROPOFOL 500 MG/50ML IV EMUL
INTRAVENOUS | Status: DC | PRN
  Administered 2016-12-22: 75 ug/kg/min via INTRAVENOUS

## 2016-12-22 MED ORDER — BISACODYL 10 MG RE SUPP: 10.0000 mg | Freq: Every day | RECTAL | Status: DC | PRN

## 2016-12-22 MED ORDER — ASPIRIN 81 MG PO CHEW
81.0000 mg | CHEWABLE_TABLET | Freq: Two times a day (BID) | ORAL | Status: DC
  Administered 2016-12-22 – 2016-12-23 (×2): 81 mg via ORAL
  Filled 2016-12-22 (×2): qty 1

## 2016-12-22 MED ORDER — METOCLOPRAMIDE HCL 5 MG/ML IJ SOLN: 5.0000 mg | Freq: Three times a day (TID) | INTRAMUSCULAR | Status: DC | PRN

## 2016-12-22 MED ORDER — OFLOXACIN 0.3 % OP SOLN
1.0000 [drp] | Freq: Two times a day (BID) | OPHTHALMIC | Status: DC
  Administered 2016-12-22 – 2016-12-23 (×2): 1 [drp] via OPHTHALMIC

## 2016-12-22 MED ORDER — HYDROMORPHONE HCL 1 MG/ML IJ SOLN: 0.5000 mg | INTRAMUSCULAR | Status: DC | PRN

## 2016-12-22 MED ORDER — TRANEXAMIC ACID 1000 MG/10ML IV SOLN
1000.0000 mg | INTRAVENOUS | Status: AC
  Administered 2016-12-22: 1000 mg via INTRAVENOUS
  Filled 2016-12-22: qty 1100

## 2016-12-22 MED ORDER — MIDAZOLAM HCL 2 MG/2ML IJ SOLN
2.0000 mg | Freq: Once | INTRAMUSCULAR | Status: AC
  Administered 2016-12-22: 2 mg via INTRAVENOUS

## 2016-12-22 MED ORDER — DIFLUPREDNATE 0.05 % OP EMUL
1.0000 [drp] | Freq: Two times a day (BID) | OPHTHALMIC | Status: DC
  Administered 2016-12-22 – 2016-12-23 (×2): 1 [drp] via OPHTHALMIC

## 2016-12-22 MED ORDER — SODIUM CHLORIDE 0.9 % IJ SOLN
INTRAMUSCULAR | Status: AC
  Filled 2016-12-22: qty 50

## 2016-12-22 MED ORDER — CELECOXIB 200 MG PO CAPS
200.0000 mg | ORAL_CAPSULE | Freq: Two times a day (BID) | ORAL | Status: DC
  Administered 2016-12-22 – 2016-12-23 (×2): 200 mg via ORAL
  Filled 2016-12-22 (×2): qty 1

## 2016-12-22 MED ORDER — HYDROCODONE-ACETAMINOPHEN 7.5-325 MG PO TABS: 1.0000 | ORAL_TABLET | ORAL | 0 refills | Status: DC | PRN

## 2016-12-22 MED ORDER — SODIUM CHLORIDE 0.9 % IJ SOLN
INTRAMUSCULAR | Status: DC | PRN
  Administered 2016-12-22: 29 mL

## 2016-12-22 MED ORDER — MENTHOL 3 MG MT LOZG: 1.0000 | LOZENGE | OROMUCOSAL | Status: DC | PRN

## 2016-12-22 MED ORDER — PROPOFOL 10 MG/ML IV BOLUS
INTRAVENOUS | Status: AC
  Filled 2016-12-22: qty 20

## 2016-12-22 MED ORDER — SODIUM CHLORIDE 0.9 % IV SOLN
INTRAVENOUS | Status: DC
  Administered 2016-12-22: 14:00:00 via INTRAVENOUS
  Filled 2016-12-22 (×4): qty 1000

## 2016-12-22 MED ORDER — ONDANSETRON HCL 4 MG/2ML IJ SOLN: 4.0000 mg | Freq: Four times a day (QID) | INTRAMUSCULAR | Status: DC | PRN

## 2016-12-22 MED ORDER — ONDANSETRON HCL 4 MG PO TABS: 4.0000 mg | ORAL_TABLET | Freq: Four times a day (QID) | ORAL | Status: DC | PRN

## 2016-12-22 MED ORDER — DIPHENHYDRAMINE HCL 25 MG PO CAPS: 25.0000 mg | ORAL_CAPSULE | Freq: Four times a day (QID) | ORAL | Status: DC | PRN

## 2016-12-22 MED ORDER — CHLORTHALIDONE 25 MG PO TABS
25.0000 mg | ORAL_TABLET | Freq: Every day | ORAL | Status: DC
  Administered 2016-12-23: 25 mg via ORAL
  Filled 2016-12-22: qty 1

## 2016-12-22 MED ORDER — DOCUSATE SODIUM 100 MG PO CAPS: 100.0000 mg | ORAL_CAPSULE | Freq: Two times a day (BID) | ORAL | 0 refills | Status: DC

## 2016-12-22 MED ORDER — METHOCARBAMOL 1000 MG/10ML IJ SOLN
500.0000 mg | Freq: Four times a day (QID) | INTRAVENOUS | Status: DC | PRN
  Administered 2016-12-22: 500 mg via INTRAVENOUS
  Filled 2016-12-22: qty 5
  Filled 2016-12-22: qty 550

## 2016-12-22 MED ORDER — CEFAZOLIN SODIUM-DEXTROSE 2-4 GM/100ML-% IV SOLN
2.0000 g | INTRAVENOUS | Status: AC
  Administered 2016-12-22: 2 g via INTRAVENOUS

## 2016-12-22 MED ORDER — MIDAZOLAM HCL 2 MG/2ML IJ SOLN
INTRAMUSCULAR | Status: AC
  Filled 2016-12-22: qty 2

## 2016-12-22 MED ORDER — 0.9 % SODIUM CHLORIDE (POUR BTL) OPTIME
TOPICAL | Status: DC | PRN
Start: 2016-12-22 — End: 2016-12-22
  Administered 2016-12-22: 1000 mL

## 2016-12-22 MED ORDER — ONDANSETRON HCL 4 MG/2ML IJ SOLN
INTRAMUSCULAR | Status: DC | PRN
  Administered 2016-12-22: 4 mg via INTRAVENOUS

## 2016-12-22 MED ORDER — FERROUS SULFATE 325 (65 FE) MG PO TABS: 325.0000 mg | ORAL_TABLET | Freq: Three times a day (TID) | ORAL | Status: DC

## 2016-12-22 MED ORDER — PHENOL 1.4 % MT LIQD: 1.0000 | OROMUCOSAL | Status: DC | PRN

## 2016-12-22 MED ORDER — BUPIVACAINE HCL (PF) 0.25 % IJ SOLN
INTRAMUSCULAR | Status: DC | PRN
  Administered 2016-12-22: 20 mL

## 2016-12-22 MED ORDER — HYDROCODONE-ACETAMINOPHEN 7.5-325 MG PO TABS
1.0000 | ORAL_TABLET | ORAL | Status: DC
  Administered 2016-12-22: 2 via ORAL
  Administered 2016-12-22 (×2): 1 via ORAL
  Administered 2016-12-22: 2 via ORAL
  Administered 2016-12-23 (×2): 1 via ORAL
  Administered 2016-12-23 (×2): 2 via ORAL
  Filled 2016-12-22 (×2): qty 1
  Filled 2016-12-22: qty 2
  Filled 2016-12-22: qty 1
  Filled 2016-12-22: qty 2
  Filled 2016-12-22: qty 1
  Filled 2016-12-22 (×2): qty 2

## 2016-12-22 MED ORDER — METOCLOPRAMIDE HCL 5 MG PO TABS: 5.0000 mg | ORAL_TABLET | Freq: Three times a day (TID) | ORAL | Status: DC | PRN

## 2016-12-22 MED ORDER — ATORVASTATIN CALCIUM 10 MG PO TABS
10.0000 mg | ORAL_TABLET | Freq: Every day | ORAL | Status: DC
  Administered 2016-12-23: 10 mg via ORAL
  Filled 2016-12-22: qty 1

## 2016-12-22 MED ORDER — DOCUSATE SODIUM 100 MG PO CAPS
100.0000 mg | ORAL_CAPSULE | Freq: Two times a day (BID) | ORAL | Status: DC
  Administered 2016-12-22 – 2016-12-23 (×2): 100 mg via ORAL
  Filled 2016-12-22 (×2): qty 1

## 2016-12-22 MED ORDER — BUPIVACAINE HCL (PF) 0.25 % IJ SOLN
INTRAMUSCULAR | Status: AC
  Filled 2016-12-22: qty 30

## 2016-12-22 MED ORDER — STERILE WATER FOR IRRIGATION IR SOLN
Status: DC | PRN
  Administered 2016-12-22: 2000 mL

## 2016-12-22 MED ORDER — MAGNESIUM CITRATE PO SOLN: 1.0000 | Freq: Once | ORAL | Status: DC | PRN

## 2016-12-22 MED ORDER — BUPIVACAINE IN DEXTROSE 0.75-8.25 % IT SOLN
INTRATHECAL | Status: DC | PRN
  Administered 2016-12-22: 2 mL via INTRATHECAL

## 2016-12-22 MED ORDER — ASPIRIN 81 MG PO CHEW: 81.0000 mg | CHEWABLE_TABLET | Freq: Two times a day (BID) | ORAL | 0 refills | Status: AC

## 2016-12-22 MED ORDER — DEXAMETHASONE SODIUM PHOSPHATE 10 MG/ML IJ SOLN
10.0000 mg | Freq: Once | INTRAMUSCULAR | Status: AC
  Administered 2016-12-22: 10 mg via INTRAVENOUS

## 2016-12-22 MED ORDER — POLYETHYLENE GLYCOL 3350 17 G PO PACK
17.0000 g | PACK | Freq: Two times a day (BID) | ORAL | 0 refills | Status: DC
Start: 2016-12-22 — End: 2019-07-15

## 2016-12-22 MED ORDER — HYDROMORPHONE HCL 1 MG/ML IJ SOLN: 0.2500 mg | INTRAMUSCULAR | Status: DC | PRN

## 2016-12-22 MED ORDER — KETOROLAC TROMETHAMINE 30 MG/ML IJ SOLN
INTRAMUSCULAR | Status: AC
  Filled 2016-12-22: qty 1

## 2016-12-22 MED ORDER — DEXAMETHASONE SODIUM PHOSPHATE 10 MG/ML IJ SOLN
10.0000 mg | Freq: Once | INTRAMUSCULAR | Status: AC
  Administered 2016-12-23: 10 mg via INTRAVENOUS
  Filled 2016-12-22: qty 1

## 2016-12-22 MED ORDER — CEFAZOLIN SODIUM-DEXTROSE 2-4 GM/100ML-% IV SOLN
INTRAVENOUS | Status: AC
  Filled 2016-12-22: qty 100

## 2016-12-22 MED ORDER — SODIUM CHLORIDE 0.9 % IR SOLN
Status: DC | PRN
  Administered 2016-12-22: 1000 mL

## 2016-12-22 MED ORDER — METHOCARBAMOL 500 MG PO TABS
500.0000 mg | ORAL_TABLET | Freq: Four times a day (QID) | ORAL | Status: DC | PRN
  Administered 2016-12-23: 500 mg via ORAL
  Filled 2016-12-22: qty 1

## 2016-12-22 MED ORDER — CEFAZOLIN SODIUM-DEXTROSE 2-4 GM/100ML-% IV SOLN
2.0000 g | Freq: Four times a day (QID) | INTRAVENOUS | Status: AC
  Administered 2016-12-22 (×2): 2 g via INTRAVENOUS
  Filled 2016-12-22 (×2): qty 100

## 2016-12-22 MED ORDER — DEXAMETHASONE SODIUM PHOSPHATE 10 MG/ML IJ SOLN
INTRAMUSCULAR | Status: AC
  Filled 2016-12-22: qty 1

## 2016-12-22 MED ORDER — CHLORHEXIDINE GLUCONATE 4 % EX LIQD: 60.0000 mL | Freq: Once | CUTANEOUS | Status: DC

## 2016-12-22 MED ORDER — METHOCARBAMOL 500 MG PO TABS: 500.0000 mg | ORAL_TABLET | Freq: Four times a day (QID) | ORAL | 0 refills | Status: DC | PRN

## 2016-12-22 MED ORDER — PROPOFOL 10 MG/ML IV BOLUS
INTRAVENOUS | Status: DC | PRN
  Administered 2016-12-22 (×2): 20 mg via INTRAVENOUS
  Administered 2016-12-22: 40 mg via INTRAVENOUS
  Administered 2016-12-22: 20 mg via INTRAVENOUS

## 2016-12-22 MED ORDER — LACTATED RINGERS IV SOLN
INTRAVENOUS | Status: DC
  Administered 2016-12-22 (×2): via INTRAVENOUS
  Administered 2016-12-22: 1000 mL via INTRAVENOUS

## 2016-12-22 MED ORDER — ALUM & MAG HYDROXIDE-SIMETH 200-200-20 MG/5ML PO SUSP
30.0000 mL | ORAL | Status: DC | PRN
  Administered 2016-12-22: 30 mL via ORAL
  Filled 2016-12-22: qty 30

## 2016-12-22 MED ORDER — PROPOFOL 10 MG/ML IV BOLUS
INTRAVENOUS | Status: AC
  Filled 2016-12-22: qty 60

## 2016-12-22 MED ORDER — KETOROLAC TROMETHAMINE 30 MG/ML IJ SOLN
INTRAMUSCULAR | Status: DC | PRN
  Administered 2016-12-22: 30 mg

## 2016-12-22 MED ORDER — FERROUS SULFATE 325 (65 FE) MG PO TABS
325.0000 mg | ORAL_TABLET | Freq: Three times a day (TID) | ORAL | Status: DC
  Administered 2016-12-22 – 2016-12-23 (×2): 325 mg via ORAL
  Filled 2016-12-22 (×2): qty 1

## 2016-12-22 SURGICAL SUPPLY — 45 items
ADH SKN CLS APL DERMABOND .7 (GAUZE/BANDAGES/DRESSINGS) ×1
BAG SPEC THK2 15X12 ZIP CLS (MISCELLANEOUS) ×1
BAG ZIPLOCK 12X15 (MISCELLANEOUS) ×1 IMPLANT
BANDAGE ACE 6X5 VEL STRL LF (GAUZE/BANDAGES/DRESSINGS) ×2 IMPLANT
BLADE SAW SGTL 13.0X1.19X90.0M (BLADE) ×2 IMPLANT
BOWL SMART MIX CTS (DISPOSABLE) ×2 IMPLANT
CAPT KNEE TOTAL 3 ATTUNE ×1 IMPLANT
CEMENT HV SMART SET (Cement) ×2 IMPLANT
CLOTH BEACON ORANGE TIMEOUT ST (SAFETY) ×2 IMPLANT
CUFF TOURN SGL QUICK 34 (TOURNIQUET CUFF) ×2
CUFF TRNQT CYL 34X4X40X1 (TOURNIQUET CUFF) ×1 IMPLANT
DECANTER SPIKE VIAL GLASS SM (MISCELLANEOUS) ×2 IMPLANT
DERMABOND ADVANCED (GAUZE/BANDAGES/DRESSINGS) ×1
DERMABOND ADVANCED .7 DNX12 (GAUZE/BANDAGES/DRESSINGS) ×1 IMPLANT
DRAPE U-SHAPE 47X51 STRL (DRAPES) ×2 IMPLANT
DRESSING AQUACEL AG SP 3.5X10 (GAUZE/BANDAGES/DRESSINGS) ×1 IMPLANT
DRSG AQUACEL AG SP 3.5X10 (GAUZE/BANDAGES/DRESSINGS) ×2
DURAPREP 26ML APPLICATOR (WOUND CARE) ×4 IMPLANT
ELECT REM PT RETURN 9FT ADLT (ELECTROSURGICAL) ×2
ELECTRODE REM PT RTRN 9FT ADLT (ELECTROSURGICAL) ×1 IMPLANT
GLOVE BIOGEL PI IND STRL 7.0 (GLOVE) IMPLANT
GLOVE BIOGEL PI IND STRL 7.5 (GLOVE) ×1 IMPLANT
GLOVE BIOGEL PI IND STRL 8.5 (GLOVE) ×1 IMPLANT
GLOVE BIOGEL PI INDICATOR 7.0 (GLOVE) ×2
GLOVE BIOGEL PI INDICATOR 7.5 (GLOVE) ×4
GLOVE BIOGEL PI INDICATOR 8.5 (GLOVE) ×2
GLOVE ECLIPSE 8.0 STRL XLNG CF (GLOVE) ×3 IMPLANT
GLOVE ORTHO TXT STRL SZ7.5 (GLOVE) ×3 IMPLANT
GLOVE SURG SS PI 7.5 STRL IVOR (GLOVE) ×1 IMPLANT
GOWN STRL REUS W/TWL LRG LVL3 (GOWN DISPOSABLE) ×2 IMPLANT
GOWN STRL REUS W/TWL XL LVL3 (GOWN DISPOSABLE) ×4 IMPLANT
HANDPIECE INTERPULSE COAX TIP (DISPOSABLE) ×2
MANIFOLD NEPTUNE II (INSTRUMENTS) ×2 IMPLANT
PACK TOTAL KNEE CUSTOM (KITS) ×2 IMPLANT
POSITIONER SURGICAL ARM (MISCELLANEOUS) ×2 IMPLANT
SET HNDPC FAN SPRY TIP SCT (DISPOSABLE) ×1 IMPLANT
SET PAD KNEE POSITIONER (MISCELLANEOUS) ×2 IMPLANT
SUT MNCRL AB 4-0 PS2 18 (SUTURE) ×2 IMPLANT
SUT VIC AB 1 CT1 36 (SUTURE) ×2 IMPLANT
SUT VIC AB 2-0 CT1 27 (SUTURE) ×6
SUT VIC AB 2-0 CT1 TAPERPNT 27 (SUTURE) ×3 IMPLANT
SUT VLOC 180 0 24IN GS25 (SUTURE) ×2 IMPLANT
TRAY FOLEY W/METER SILVER 16FR (SET/KITS/TRAYS/PACK) ×2 IMPLANT
WRAP KNEE MAXI GEL POST OP (GAUZE/BANDAGES/DRESSINGS) ×2 IMPLANT
YANKAUER SUCT BULB TIP 10FT TU (MISCELLANEOUS) ×2 IMPLANT

## 2016-12-22 NOTE — Interval H&P Note (Signed)
History and Physical Interval Note:  12/22/2016 7:22 AM  Jose Gamble  has presented today for surgery, with the diagnosis of Right knee osteoarthritis  The various methods of treatment have been discussed with the patient and family. After consideration of risks, benefits and other options for treatment, the patient has consented to  Procedure(s) with comments: RIGHT TOTAL KNEE ARTHROPLASTY (Right) - Requests 90 mins as a surgical intervention .  The patient's history has been reviewed, patient examined, no change in status, stable for surgery.  I have reviewed the patient's chart and labs.  Questions were answered to the patient's satisfaction.     [name]

## 2016-12-22 NOTE — Anesthesia Postprocedure Evaluation (Signed)
Anesthesia Post Note  Patient: Jose Gamble  Procedure(s) Performed: Procedure(s) (LRB): RIGHT TOTAL KNEE ARTHROPLASTY (Right)  Patient location during evaluation: PACU Anesthesia Type: Spinal Pain management: pain level controlled Vital Signs Assessment: post-procedure vital signs reviewed and stable Respiratory status: spontaneous breathing Cardiovascular status: stable Postop Assessment: spinal receding Anesthetic complications: no       Last Vitals:  Vitals:   12/22/16 1215 12/22/16 1230  BP: 110/77 118/69  Pulse: (!) 50 (!) 47  Resp: 19 12  Temp: 36.4 C     Last Pain:  Vitals:   12/22/16 1230  TempSrc:   PainSc: 0-No pain    LLE Motor Response: Purposeful movement (12/22/16 1230)   RLE Motor Response: Purposeful movement (12/22/16 1230)   L Sensory Level: S1-Sole of foot, small toes (12/22/16 1230) R Sensory Level: S1-Sole of foot, small toes (12/22/16 1230)  Matayah Reyburn

## 2016-12-22 NOTE — Discharge Instructions (Signed)

## 2016-12-22 NOTE — Progress Notes (Signed)
Assisted Dr Greenwith right, ultrasound guided, adductor canal block. Side rails up, monitors on throughout procedure. See vital signs in flow sheet. Tolerated Procedure well.  

## 2016-12-22 NOTE — Anesthesia Procedure Notes (Signed)
Anesthesia Regional Block: Adductor canal block   Pre-Anesthetic Checklist: ,, timeout performed, Correct Patient, Correct Site, Correct Laterality, Correct Procedure, Correct Position, site marked, Risks and benefits discussed,  Surgical consent,  Pre-op evaluation,  At surgeon's request and post-op pain management  Laterality: Right  Prep: chloraprep       Needles:  Injection technique: Single-shot  Needle Type: Stimulator Needle - 80          Additional Needles:   Procedures: Doppler guided, Ultrasound guided,,,,,,  Narrative:  Start time: 12/22/2016 8:05 AM End time: 12/22/2016 8:20 AM Injection made incrementally with aspirations every 5 mL.  Performed by: Personally  Anesthesiologist: Belinda Block

## 2016-12-22 NOTE — Anesthesia Preprocedure Evaluation (Addendum)
Anesthesia Evaluation  Patient identified by MRN, date of birth, ID band Patient awake    Reviewed: Allergy & Precautions  Airway Mallampati: II  TM Distance: >3 FB     Dental   Pulmonary former smoker,    breath sounds clear to auscultation       Cardiovascular hypertension,  Rhythm:Regular Rate:Normal     Neuro/Psych    GI/Hepatic negative GI ROS, Neg liver ROS,   Endo/Other  negative endocrine ROS  Renal/GU negative Renal ROS     Musculoskeletal  (+) Arthritis ,   Abdominal   Peds  Hematology   Anesthesia Other Findings   Reproductive/Obstetrics                             Anesthesia Physical Anesthesia Plan  ASA: II  Anesthesia Plan:    Post-op Pain Management:  Regional for Post-op pain   Induction: Intravenous  Airway Management Planned: Simple Face Mask  Additional Equipment:   Intra-op Plan:   Post-operative Plan:   Informed Consent: I have reviewed the patients History and Physical, chart, labs and discussed the procedure including the risks, benefits and alternatives for the proposed anesthesia with the patient or authorized representative who has indicated his/her understanding and acceptance.   Dental advisory given  Plan Discussed with: CRNA and Anesthesiologist  Anesthesia Plan Comments:         Anesthesia Quick Evaluation

## 2016-12-22 NOTE — Evaluation (Signed)
Physical Therapy Evaluation Patient Details Name: Jose Gamble MRN: [id number] DOB: [birth year] Today's Date: 12/22/2016   History of Present Illness  R TKA  Clinical Impression  Pt is s/p TKA resulting in the deficits listed below (see PT Problem List). Pt ambulated 38' with RW. He is able to perform R SLR independently. Instructed pt in HEP. Good progress expected.  Pt will benefit from skilled PT to increase their independence and safety with mobility to allow discharge to the venue listed below.      Follow Up Recommendations Home health PT    Equipment Recommendations  None recommended by PT    Recommendations for Other Services       Precautions / Restrictions Precautions Precautions: Knee Precaution Booklet Issued: No Precaution Comments: reviewed no pillow under knee Restrictions Weight Bearing Restrictions: No Other Position/Activity Restrictions: wbat      Mobility  Bed Mobility Overal bed mobility: Needs Assistance Bed Mobility: Supine to Sit     Supine to sit: Supervision;HOB elevated     General bed mobility comments: no physical assist needed  Transfers Overall transfer level: Needs assistance Equipment used: Rolling walker (2 wheeled) Transfers: Sit to/from Stand Sit to Stand: Min guard         General transfer comment: VCs hand placement  Ambulation/Gait Ambulation/Gait assistance: Min guard Ambulation Distance (Feet): 60 Feet Assistive device: Rolling walker (2 wheeled) Gait Pattern/deviations: Step-to pattern;Decreased step length - right;Decreased step length - left   Gait velocity interpretation: Below normal speed for age/gender General Gait Details: VCs for sequencing and to lift head, no LOB  Stairs            Wheelchair Mobility    Modified Rankin (Stroke Patients Only)       Balance Overall balance assessment: Modified Independent                                           Pertinent Vitals/Pain  Pain Assessment: 0-10 Pain Score: 5  Pain Location: r knee and thigh Pain Descriptors / Indicators: Sore Pain Intervention(s): Limited activity within patient's tolerance;Monitored during session;Premedicated before session;Ice applied    Home Living Family/patient expects to be discharged to:: Private residence   Available Help at Discharge: Family;Available 24 hours/day;Friend(s) Type of Home: House Home Access: Stairs to enter Entrance Stairs-Rails: Psychiatric nurse of Steps: 4 Home Layout: Laundry or work area in [employer]: Environmental consultant - 2 wheels;Bedside commode;Cane - quad      Prior Function Level of Independence: Independent               Hand Dominance        Extremity/Trunk Assessment   Upper Extremity Assessment Upper Extremity Assessment: Overall WFL for tasks assessed    Lower Extremity Assessment Lower Extremity Assessment: RLE deficits/detail RLE Deficits / Details: knee 0-50* AAROM, 3/5 SLR, 4/5 knee ext    Cervical / Trunk Assessment Cervical / Trunk Assessment: Normal  Communication   Communication: No difficulties  Cognition Arousal/Alertness: Awake/alert Behavior During Therapy: WFL for tasks assessed/performed Overall Cognitive Status: Within Functional Limits for tasks assessed                      General Comments      Exercises Total Joint Exercises Ankle Circles/Pumps: AROM;Both;10 reps Quad Sets: AROM;Both;10 reps;Supine Heel Slides: AAROM;Right;10 reps Straight Leg Raises: AROM;5 reps;Supine;Right Long  Arc Quad: AROM;Right;5 reps;Seated Goniometric ROM: 0-50* AAROM R knee   Assessment/Plan    PT Assessment Patient needs continued PT services  PT Problem List Decreased strength;Decreased range of motion;Decreased activity tolerance;Pain;Decreased mobility       PT Treatment Interventions DME instruction;Gait training;Stair training;Functional mobility training;Therapeutic  exercise;Therapeutic activities;Patient/family education    PT Goals (Current goals can be found in the Care Plan section)  Acute Rehab PT Goals Patient Stated Goal: to be able to do yardwork with his tractor PT Goal Formulation: With patient/family Time For Goal Achievement: 12/29/16 Potential to Achieve Goals: Good    Frequency 7X/week   Barriers to discharge        Co-evaluation               End of Session Equipment Utilized During Treatment: Gait belt Activity Tolerance: Patient tolerated treatment well Patient left: in chair;with call bell/phone within reach Nurse Communication: Mobility status PT Visit Diagnosis: Difficulty in walking, not elsewhere classified (R26.2);Pain Pain - Right/Left: Right Pain - part of body: Knee    Functional Assessment Tool Used: AM-PAC 6 Clicks Basic Mobility Functional Limitation: Mobility: Walking and moving around Mobility: Walking and Moving Around Current Status [id number]): At least 20 percent but less than 40 percent impaired, limited or restricted Mobility: Walking and Moving Around Goal Status [phone]): At least 1 percent but less than 20 percent impaired, limited or restricted    Time: [id number] PT Time Calculation (min) (ACUTE ONLY): 31 min   Charges:   PT Evaluation $PT Eval Low Complexity: 1 Procedure PT Treatments $Gait Training: 8-22 mins   PT G Codes:   PT G-Codes **NOT FOR INPATIENT CLASS** Functional Assessment Tool Used: AM-PAC 6 Clicks Basic Mobility Functional Limitation: Mobility: Walking and moving around Mobility: Walking and Moving Around Current Status [id number]): At least 20 percent but less than 40 percent impaired, limited or restricted Mobility: Walking and Moving Around Goal Status [phone]): At least 1 percent but less than 20 percent impaired, limited or restricted     Philomena Doheny 12/22/2016, 4:51 PM [phone]

## 2016-12-22 NOTE — Transfer of Care (Signed)
Immediate Anesthesia Transfer of Care Note  Patient: Jose Gamble  Procedure(s) Performed: Procedure(s) with comments: RIGHT TOTAL KNEE ARTHROPLASTY (Right) - Requests 90 mins; Adductor Block  Patient Location: PACU  Anesthesia Type:Regional and Spinal  Level of Consciousness: awake, alert  and oriented  Airway & Oxygen Therapy: Patient Spontanous Breathing and Patient connected to face mask oxygen  Post-op Assessment: Report given to RN and Post -op Vital signs reviewed and stable  Post vital signs: Reviewed and stable  Last Vitals:  Vitals:   12/22/16 0645  BP: (!) 161/81  Pulse: 65  Resp: 16  Temp: 36.6 C    Last Pain:  Vitals:   12/22/16 0645  TempSrc: Oral      Patients Stated Pain Goal: 4 ([id number])  Complications: No apparent anesthesia complications

## 2016-12-22 NOTE — Anesthesia Procedure Notes (Signed)
Spinal  Patient location during procedure: OR Start time: 12/22/2016 9:35 AM End time: 12/22/2016 9:38 AM Reason for block: at surgeon's request Staffing Resident/CRNA: Anne Fu Performed: resident/CRNA  Preanesthetic Checklist Completed: patient identified, site marked, surgical consent, pre-op evaluation, timeout performed, IV checked, risks and benefits discussed and monitors and equipment checked Spinal Block Patient position: sitting Prep: DuraPrep Patient monitoring: heart rate, continuous pulse ox and blood pressure Approach: right paramedian Location: L2-3 Injection technique: single-shot Needle Needle type: Pencan  Needle gauge: 24 G Needle length: 9 cm Assessment Sensory level: T6 Additional Notes Expiration date of kit checked and confirmed. Patient tolerated procedure well, without complications. X 1 attempt with noted clear CSF return. Loss of motor and sensory on exam post injection.

## 2016-12-22 NOTE — Op Note (Signed)
NAME:  PEARSON WIDMEYER                      MEDICAL RECORD NO.:  [id number]                             FACILITY:  [facility]      PHYSICIAN:  Pietro Cassis. Alvan Dame, M.D.  DATE OF BIRTH:  October 12, 1941      DATE OF PROCEDURE:  12/22/2016                                     OPERATIVE REPORT         PREOPERATIVE DIAGNOSIS:  Right knee osteoarthritis.      POSTOPERATIVE DIAGNOSIS:  Right knee osteoarthritis.      FINDINGS:  The patient was noted to have complete loss of cartilage and   bone-on-bone arthritis with associated osteophytes in the lateral and patellofemoral compartments of   the knee with a significant synovitis and associated effusion.      PROCEDURE:  Right total knee replacement.      COMPONENTS USED:  DePuy Attune rotating platform posterior stabilized knee   system, a size 7 femur, 7 tibia, size 14 PS AOX insert, and 38 anatomic patellar   button.      SURGEON:  Pietro Cassis. Alvan Dame, M.D.      ASSISTANT:  Danae Orleans, PA-C.      ANESTHESIA:  Regional and Spinal.      SPECIMENS:  None.      COMPLICATION:  None.      DRAINS:  None.  EBL: <150cc       TOURNIQUET TIME:   Total Tourniquet Time Documented: Thigh (Right) - 47 minutes Total: Thigh (Right) - 47 minutes  .      The patient was stable to the recovery room.      INDICATION FOR PROCEDURE:  DAUN LAMMIE is a 76 y.o. male patient of   mine.  The patient had been seen, evaluated, and treated conservatively in the   office with medication, activity modification, and injections.  The patient had   radiographic changes of bone-on-bone arthritis with endplate sclerosis and osteophytes noted.      The patient failed conservative measures including medication, injections, and activity modification, and at this point was ready for more definitive measures.   Based on the radiographic changes and failed conservative measures, the patient   decided to proceed with total knee replacement.  Risks of infection,   DVT, component  failure, need for revision surgery, postop course, and   expectations were all   discussed and reviewed.  Consent was obtained for benefit of pain   relief.      PROCEDURE IN DETAIL:  The patient was brought to the operative theater.   Once adequate anesthesia, preoperative antibiotics, 2 gm of Ancef, 1 gm of Tranexamic Acid, and 10 mg of Decadron administered, the patient was positioned supine with the right thigh tourniquet placed.  The  right lower extremity was prepped and draped in sterile fashion.  A time-   out was performed identifying the patient, planned procedure, and   extremity.      The right lower extremity was placed in the [facility] leg holder.  The leg was   exsanguinated, tourniquet elevated to 250 mmHg.  A midline incision was  made followed by median parapatellar arthrotomy.  Following initial   exposure, attention was first directed to the patella.  Precut   measurement was noted to be 26 mm.  I resected down to 14 mm and used a   38 anatomic patellar button to restore patellar height as well as cover the cut   surface.  A very large lateral based loose body was removed from the lateral gutter.     The lug holes were drilled and a metal shim was placed to protect the   patella from retractors and saw blades.      At this point, attention was now directed to the femur.  The femoral   canal was opened with a drill, irrigated to try to prevent fat emboli.  An   intramedullary rod was passed at 5 degrees valgus, 9 mm of bone was   resected off the distal femur.  Following this resection, the tibia was   subluxated anteriorly.  Using the extramedullary guide, 2 mm of bone was resected off   the proximal lateral tibia.  We confirmed the gap would be   stable medially and laterally with at least the size 6 spacer block as well as confirmed   the cut was perpendicular in the coronal plane, checking with an alignment rod.      Once this was done, I sized the femur to be a  size 7 in the anterior-   posterior dimension, chose a standard component based on medial and   lateral dimension.  The size 7 rotation block was then pinned in   position anterior referenced using the C-clamp to set rotation.  The   anterior, posterior, and  chamfer cuts were made without difficulty nor   notching making certain that I was along the anterior cortex to help   with flexion gap stability.  I now used laminar spreaders to open up the back of the knee to remove posterior femoral osteopyhtes and larger posterior lateral loose body.     The final box cut was made off the lateral aspect of distal femur.      At this point, the tibia was sized to be a size 7, the size 7 tray was   then pinned in position through the medial third of the tubercle,   drilled, and keel punched.  Trial reduction was now carried with a 7 femur,  7 tibia, up to the size 14 PS insert, and the 38 anatomic patella botton.  The knee was brought to   extension, full extension with good flexion stability with the patella   tracking through the trochlea without application of pressure.  Given   all these findings the femoral lug holes were drilled and then the trial components removed.  Final components were   opened and cement was mixed.  The knee was irrigated with normal saline   solution and pulse lavage.  The synovial lining was   then injected with 20 cc of 0.25% Marcaine with epinephrine and 1 cc of Toradol plus 30 cc of NS for a total of 61 cc.      The knee was irrigated.  Final implants were then cemented onto clean and   dried cut surfaces of bone with the knee brought to extension with a size 14 PS trial insert.      Once the cement had fully cured, the excess cement was removed   throughout the knee.  I confirmed I was satisfied with the  range of   motion and stability, and the final size 14 PS AOX insert was chosen.  It was   placed into the knee.      The tourniquet had been let down at 47  minutes.  No significant   hemostasis required.  The   extensor mechanism was then reapproximated using #1 Vicryl and #0 V-lock sutures with the knee   in flexion.  The   remaining wound was closed with 2-0 Vicryl and running 4-0 Monocryl.   The knee was cleaned, dried, dressed sterilely using Dermabond and   Aquacel dressing.  The patient was then   brought to recovery room in stable condition, tolerating the procedure   well.   Please note that Physician Assistant, Danae Orleans, PA-C, was present for the entirety of the case, and was utilized for pre-operative positioning, peri-operative retractor management, general facilitation of the procedure.  He was also utilized for primary wound closure at the end of the case.              Pietro Cassis Alvan Dame, M.D.    12/22/2016 11:17 AM

## 2016-12-23 DIAGNOSIS — M1711 Unilateral primary osteoarthritis, right knee: Secondary | ICD-10-CM | POA: Diagnosis not present

## 2016-12-23 LAB — BASIC METABOLIC PANEL
Anion gap: 7 (ref 5–15)
BUN: 23 mg/dL — AB (ref 6–20)
CHLORIDE: 107 mmol/L (ref 101–111)
CO2: 26 mmol/L (ref 22–32)
Calcium: 8.4 mg/dL — ABNORMAL LOW (ref 8.9–10.3)
Creatinine, Ser: 0.89 mg/dL (ref 0.61–1.24)
GFR calc Af Amer: >60 mL/min (ref >60)
Glucose, Bld: 144 mg/dL — ABNORMAL HIGH (ref 65–99)
POTASSIUM: 3.4 mmol/L — AB (ref 3.5–5.1)
SODIUM: 140 mmol/L (ref 135–145)

## 2016-12-23 LAB — CBC
HCT: 34.5 % — ABNORMAL LOW (ref 39.0–52.0)
HEMOGLOBIN: 11.8 g/dL — AB (ref 13.0–17.0)
MCH: 31.2 pg (ref 26.0–34.0)
MCHC: 34.2 g/dL (ref 30.0–36.0)
MCV: 91.3 fL (ref 78.0–100.0)
PLATELETS: 171 10*3/uL (ref 150–400)
RBC: 3.78 MIL/uL — AB (ref 4.22–5.81)
RDW: 13 % (ref 11.5–15.5)
WBC: 17.3 10*3/uL — AB (ref 4.0–10.5)

## 2016-12-23 MED ORDER — POTASSIUM CHLORIDE CRYS ER 20 MEQ PO TBCR
40.0000 meq | EXTENDED_RELEASE_TABLET | Freq: Once | ORAL | Status: AC
  Administered 2016-12-23: 40 meq via ORAL
  Filled 2016-12-23: qty 2

## 2016-12-23 NOTE — Evaluation (Signed)
Occupational Therapy Evaluation Patient Details Name: Jose Gamble MRN: [id number] DOB: February 03, 1941 Today's Date: 12/23/2016    History of Present Illness R TKA   Clinical Impression   This 76 year old man was admitted for the above sx. All education was completed. No further OT is needed at this time    Follow Up Recommendations  No OT follow up;Supervision/Assistance - 24 hour    Equipment Recommendations  Other (comment) (has 3:1)    Recommendations for Other Services       Precautions / Restrictions Precautions Precautions: Knee Precaution Booklet Issued: No Precaution Comments: reviewed no pillow under knee Restrictions Weight Bearing Restrictions: No Other Position/Activity Restrictions: wbat      Mobility Bed Mobility         Supine to sit: Supervision;HOB elevated        Transfers   Equipment used: Rolling walker (2 wheeled) Transfers: Sit to/from Stand Sit to Stand: supervision         General transfer comment: VCs hand placement    Balance                                            ADL Overall ADL's : Needs assistance/impaired Eating/Feeding: Independent   Grooming: Supervision/safety;Oral care;Standing   Upper Body Bathing: Set up;Sitting   Lower Body Bathing: Minimal assistance;Sit to/from stand   Upper Body Dressing : Set up;Sitting   Lower Body Dressing: Minimal assistance;Sit to/from stand   Toilet Transfer: Min guard;BSC;RW;Ambulation   Toileting- Water quality scientist and Hygiene: Min guard;Sit to/from stand       Functional mobility during ADLs: Min guard General ADL Comments: pt moves quickly; cues for safety     Vision         Perception     Praxis      Pertinent Vitals/Pain Pain Score: 5  Pain Location: r knee and thigh Pain Descriptors / Indicators: Sore Pain Intervention(s): Limited activity within patient's tolerance;Monitored during session;Premedicated before session;Repositioned;Ice  applied     Hand Dominance     Extremity/Trunk Assessment Upper Extremity Assessment Upper Extremity Assessment: Overall WFL for tasks assessed           Communication     Cognition Arousal/Alertness: Awake/alert Behavior During Therapy: WFL for tasks assessed/performed Overall Cognitive Status: Within Functional Limits for tasks assessed                     General Comments       Exercises       Shoulder Instructions      Home Living Family/patient expects to be discharged to:: private residence Living Arrangements: Alone  Has family 24/7, 3:1 commode and walk in shower                                      Prior Functioning/Environment                   OT Problem List:        OT Treatment/Interventions:      OT Goals(Current goals can be found in the care plan section) Acute Rehab OT Goals Patient Stated Goal: to be able to do yardwork with his tractor  OT Frequency: Min 2X/week   Barriers to D/C:  Co-evaluation              End of Session    Activity Tolerance: Patient tolerated treatment well Patient left: in chair;with family/visitor present;with chair alarm set  OT Visit Diagnosis: Pain Pain - Right/Left: Right Pain - part of body: Knee                ADL either performed or assessed with clinical judgement  Time: [id number] OT Time Calculation (min): 22 min Charges:  OT General Charges $OT Visit: 1 Procedure OT Evaluation $OT Eval Low Complexity: 1 Procedure G-Codes: OT G-codes **NOT FOR INPATIENT CLASS** Functional Assessment Tool Used: AM-PAC 6 Clicks Daily Activity;Clinical judgement Functional Limitation: Self care Self Care Current Status [id number]): At least 20 percent but less than 40 percent impaired, limited or restricted Self Care Goal Status [id number]): At least 20 percent but less than 40 percent impaired, limited or restricted Self Care Discharge Status [phone]): At least 20 percent  but less than 40 percent impaired, limited or restricted   Lesle Chris, OTR/L [id number] 12/23/2016  Breyon Blass 12/23/2016, 9:49 AM

## 2016-12-23 NOTE — Care Management Note (Signed)
Case Management Note  Patient Details  Name: STONEWALL DOSS MRN: [id number] Date of Birth: [birth year]  Subjective/Objective:                  R TKA Action/Plan: Discharge planning Expected Discharge Date:  12/23/16               Expected Discharge Plan:  [place]  In-House Referral:     Discharge planning Services  CM Consult  Post Acute Care Choice:  Home Health Choice offered to:  Patient  DME Arranged:  N/A DME Agency:  NA  HH Arranged:  PT [place] Agency:  [facility] ([facility])  Status of Service:  Completed, signed off  If discussed at [employer], dates discussed:    Additional Comments: CM met with pt in room to offer choice of home health agency. Pt chooses [facility] to render HHPT.  Referral called to [facility] rep, Tim. Pt states he has all DME needed at home. No other CM needs were communicated. Dellie Catholic, RN 12/23/2016, 2:51 PM

## 2016-12-23 NOTE — Progress Notes (Signed)
Patient ID: Jose Gamble, male   DOB: July 28, 1941, 76 y.o.   MRN: [id number] Subjective: 1 Day Post-Op Procedure(s) (LRB): RIGHT TOTAL KNEE ARTHROPLASTY (Right)    Patient reports pain as moderate. Reports a dull ache all night long hard to get comfortable supine in hospital bed  Objective:   VITALS:   Vitals:   12/23/16 0210 12/23/16 0516  BP: 129/68 (!) 100/58  Pulse: 60 61  Resp: 16 16  Temp: 98 F (36.7 C) 98 F (36.7 C)    Neurovascular intact - importantly good motor and sensory function in his peroneal nerve distribution after correcting his bad valgus knee Incision: dressing C/D/I  LABS  Recent Labs  12/23/16 0356  HGB 11.8*  HCT 34.5*  WBC 17.3*  PLT 171     Recent Labs  12/23/16 0356  NA 140  K 3.4*  BUN 23*  CREATININE 0.89  GLUCOSE 144*    No results for input(s): LABPT, INR in the last 72 hours.   Assessment/Plan: 1 Day Post-Op Procedure(s) (LRB): RIGHT TOTAL KNEE ARTHROPLASTY (Right)   Advance diet Up with therapy Discharge home with home health today per patients request from financial standpoint  RTC in 2 weeks

## 2016-12-23 NOTE — Progress Notes (Signed)
Physical Therapy Treatment Patient Details Name: Jose Gamble MRN: [id number] DOB: 08/23/1941 Today's Date: 12/23/2016    History of Present Illness R TKA    PT Comments    Pt has progressed very well with mobility, he ambulated 300' with RW, completed stair training, and demonstrates understanding of HEP. He has excellent flexion of 95* AROM R knee and is independent with SLR. From PT standpoint he is ready to DC home.    Follow Up Recommendations  Home health PT     Equipment Recommendations  None recommended by PT    Recommendations for Other Services       Precautions / Restrictions Precautions Precautions: Knee Precaution Booklet Issued: No Precaution Comments: reviewed no pillow under knee Restrictions Weight Bearing Restrictions: No Other Position/Activity Restrictions: wbat    Mobility  Bed Mobility         Supine to sit: Supervision;HOB elevated     General bed mobility comments: up in chair  Transfers Overall transfer level: Modified independent Equipment used: Rolling walker (2 wheeled) Transfers: Sit to/from Stand Sit to Stand: Modified independent (Device/Increase time)         General transfer comment: good hand placement  Ambulation/Gait Ambulation/Gait assistance: Modified independent (Device/Increase time) Ambulation Distance (Feet): 300 Feet Assistive device: Rolling walker (2 wheeled) Gait Pattern/deviations: Step-through pattern     General Gait Details: steady, no LOB   Stairs Stairs: Yes   Stair Management: One rail Right;With cane;Step to pattern;Forwards Number of Stairs: 5    Wheelchair Mobility    Modified Rankin (Stroke Patients Only)       Balance Overall balance assessment: Modified Independent                                  Cognition Arousal/Alertness: Awake/alert Behavior During Therapy: WFL for tasks assessed/performed Overall Cognitive Status: Within Functional Limits for tasks  assessed                      Exercises Total Joint Exercises Short Arc Quad: AROM;Right;10 reps;Supine Heel Slides: AAROM;Right;10 reps;Supine Straight Leg Raises: AROM;5 reps;Supine;Right Long Arc Quad: AROM;Right;10 reps;Seated Knee Flexion: AROM;Right;10 reps;Seated Goniometric ROM: 0-95* AAROM R knee    General Comments        Pertinent Vitals/Pain Pain Score: 6  Pain Location: r knee and thigh with walking Pain Descriptors / Indicators: Sore Pain Intervention(s): Limited activity within patient's tolerance;Monitored during session;Premedicated before session;Ice applied    Home Living Family/patient expects to be discharged to:: Private residence Living Arrangements: Alone                  Prior Function            PT Goals (current goals can now be found in the care plan section) Acute Rehab PT Goals Patient Stated Goal: to be able to do yardwork with his tractor PT Goal Formulation: With patient/family Time For Goal Achievement: 12/29/16 Potential to Achieve Goals: Good Progress towards PT goals: Goals met/education completed, patient discharged from PT    Frequency    7X/week      PT Plan Current plan remains appropriate    Co-evaluation             End of Session Equipment Utilized During Treatment: Gait belt Activity Tolerance: Patient tolerated treatment well Patient left: in chair;with call bell/phone within reach Nurse Communication: Mobility status PT Visit Diagnosis: Difficulty in  walking, not elsewhere classified (R26.2);Pain Pain - Right/Left: Right Pain - part of body: Knee     Time: 1041-1110 PT Time Calculation (min) (ACUTE ONLY): 29 min  Charges:  $Gait Training: 8-22 mins $Therapeutic Exercise: 8-22 mins                    G Codes:       Philomena Doheny 12/23/2016, 11:18 AM [phone]

## 2016-12-24 DIAGNOSIS — Z96651 Presence of right artificial knee joint: Secondary | ICD-10-CM | POA: Diagnosis not present

## 2016-12-24 DIAGNOSIS — Z87891 Personal history of nicotine dependence: Secondary | ICD-10-CM | POA: Diagnosis not present

## 2016-12-24 DIAGNOSIS — Z471 Aftercare following joint replacement surgery: Secondary | ICD-10-CM | POA: Diagnosis not present

## 2016-12-24 DIAGNOSIS — I10 Essential (primary) hypertension: Secondary | ICD-10-CM | POA: Diagnosis not present

## 2016-12-25 NOTE — Discharge Summary (Signed)
Physician Discharge Summary  Patient ID: Jose Gamble MRN: [id number] DOB/AGE: 1941/04/17 76 y.o.  Admit date: 12/22/2016 Discharge date: 12/23/2016   Procedures:  Procedure(s) (LRB): RIGHT TOTAL KNEE ARTHROPLASTY (Right)  Attending Physician:  Dr. Paralee Cancel   Admission Diagnoses:   Right knee primary OA / pain  Discharge Diagnoses:  Principal Problem:   S/P right TKA  Past Medical History:  Diagnosis Date  . Arthritis   . History of kidney stones   . Hypertension     HPI:    Jose Gamble, 76 y.o. male, has a history of pain and functional disability in the right knee due to arthritis and has failed non-surgical conservative treatments for greater than 12 weeks to include NSAID's and/or analgesics and activity modification.  Onset of symptoms was gradual, starting 1+ years ago with gradually worsening course since that time. The patient noted no past surgery on the right knee(s).  Patient currently rates pain in the right knee(s) at 8 out of 10 with activity. Patient has worsening of pain with activity and weight bearing, pain that interferes with activities of daily living, pain with passive range of motion, crepitus and joint swelling.  Patient has evidence of periarticular osteophytes and joint space narrowing by imaging studies.  There is no active infection.   Risks, benefits and expectations were discussed with the patient.  Risks including but not limited to the risk of anesthesia, blood clots, nerve damage, blood vessel damage, failure of the prosthesis, infection and up to and including death.  Patient understand the risks, benefits and expectations and wishes to proceed with surgery.   PCP: Scarlette Calico, MD   Discharged Condition: good  Hospital Course:  Patient underwent the above stated procedure on 12/22/2016. Patient tolerated the procedure well and brought to the recovery room in good condition and subsequently to the floor.  POD #1 BP: 100/58 ; Pulse: 61 ; Temp:  98 F (36.7 C) ; Resp: 16 Patient reports pain as moderate. Reports a dull ache all night long hard to get comfortable supine in hospital bed. Neurovascular intact - importantly good motor and sensory function in his peroneal nerve distribution after correcting his bad valgus knee.  Incision: dressing C/D/I.  LABS  Basename    HGB     11.8  HCT     34.5    Discharge Exam: General appearance: alert, cooperative and no distress Extremities: Homans sign is negative, no sign of DVT, no edema, redness or tenderness in the calves or thighs and no ulcers, gangrene or trophic changes  Disposition: Home with follow up in 2 weeks   Follow-up Information    [name], MD. Schedule an appointment as soon as possible for a visit in 2 week(s).   Specialty:  Orthopedic Surgery Contact information: [address]  [place] [address] [phone]        [facility] Follow up.   Specialty:  Home Health Services Why:  home health physical therapy Contact information: [place] [address] [phone]           Discharge Instructions    Call MD / Call 911    Complete by:  As directed    If you experience chest pain or shortness of breath, CALL 911 and be transported to the hospital emergency room.  If you develope a fever above 101 F, pus (white drainage) or increased drainage or redness at the wound, or calf pain, call  your surgeon's office.   Change dressing    Complete by:  As directed    Maintain surgical dressing until follow up in the clinic. If the edges start to pull up, may reinforce with tape. If the dressing is no longer working, may remove and cover with gauze and tape, but must keep the area dry and clean.  Call with any questions or concerns.   Constipation Prevention    Complete by:  As directed    Drink plenty of fluids.  Prune juice may be helpful.  You may use a stool softener, such as Colace (over the counter) 100 mg  twice a day.  Use MiraLax (over the counter) for constipation as needed.   Diet - low sodium heart healthy    Complete by:  As directed    Discharge instructions    Complete by:  As directed    Maintain surgical dressing until follow up in the clinic. If the edges start to pull up, may reinforce with tape. If the dressing is no longer working, may remove and cover with gauze and tape, but must keep the area dry and clean.  Follow up in 2 weeks at [facility]. Call with any questions or concerns.   Increase activity slowly as tolerated    Complete by:  As directed    Weight bearing as tolerated with assist device (walker, cane, etc) as directed, use it as long as suggested by your surgeon or therapist, typically at least 4-6 weeks.   TED hose    Complete by:  As directed    Use stockings (TED hose) for 2 weeks on both leg(s).  You may remove them at night for sleeping.      Allergies as of 12/23/2016      Reactions   Diovan [valsartan] Other (See Comments)   Severe fatigue      Medication List    STOP taking these medications   aspirin EC 81 MG tablet Replaced by:  aspirin 81 MG chewable tablet     TAKE these medications   aspirin 81 MG chewable tablet Chew 1 tablet (81 mg total) by mouth 2 (two) times daily. Take for 4 weeks. Replaces:  aspirin EC 81 MG tablet   atorvastatin 10 MG tablet Commonly known as:  LIPITOR Take 1 tablet (10 mg total) by mouth daily.   chlorthalidone 25 MG tablet Commonly known as:  HYGROTON Take 1 tablet (25 mg total) by mouth daily.   docusate sodium 100 MG capsule Commonly known as:  COLACE Take 1 capsule (100 mg total) by mouth 2 (two) times daily.   DUREZOL 0.05 % Emul Generic drug:  Difluprednate Place 1 drop into both eyes 2 (two) times daily.   ferrous sulfate 325 (65 FE) MG tablet Commonly known as:  FERROUSUL Take 1 tablet (325 mg total) by mouth 3 (three) times daily with meals.   HYDROcodone-acetaminophen 7.5-325 MG  tablet Commonly known as:  NORCO Take 1-2 tablets by mouth every 4 (four) hours as needed for moderate pain or severe pain.   methocarbamol 500 MG tablet Commonly known as:  ROBAXIN Take 1 tablet (500 mg total) by mouth every 6 (six) hours as needed for muscle spasms.   ofloxacin 0.3 % ophthalmic solution Commonly known as:  OCUFLOX Place 1 drop into both eyes 2 (two) times daily.   polyethylene glycol packet Commonly known as:  MIRALAX / GLYCOLAX Take 17 g by mouth 2 (two) times daily.  Signed: [place] Pugh. Shainna Faux   PA-C  12/25/2016, 2:16 PM

## 2017-01-01 DIAGNOSIS — Z961 Presence of intraocular lens: Secondary | ICD-10-CM | POA: Diagnosis not present

## 2017-01-13 DIAGNOSIS — M25561 Pain in right knee: Secondary | ICD-10-CM | POA: Diagnosis not present

## 2017-01-16 DIAGNOSIS — M25561 Pain in right knee: Secondary | ICD-10-CM | POA: Diagnosis not present

## 2017-01-22 DIAGNOSIS — M25561 Pain in right knee: Secondary | ICD-10-CM | POA: Diagnosis not present

## 2017-01-27 DIAGNOSIS — M25561 Pain in right knee: Secondary | ICD-10-CM | POA: Diagnosis not present

## 2017-01-29 DIAGNOSIS — M25561 Pain in right knee: Secondary | ICD-10-CM | POA: Diagnosis not present

## 2017-02-03 DIAGNOSIS — M25561 Pain in right knee: Secondary | ICD-10-CM | POA: Diagnosis not present

## 2017-02-04 DIAGNOSIS — Z471 Aftercare following joint replacement surgery: Secondary | ICD-10-CM | POA: Diagnosis not present

## 2017-02-04 DIAGNOSIS — Z96651 Presence of right artificial knee joint: Secondary | ICD-10-CM | POA: Diagnosis not present

## 2017-04-08 ENCOUNTER — Encounter: Payer: Self-pay | Admitting: Internal Medicine

## 2017-04-08 ENCOUNTER — Ambulatory Visit (INDEPENDENT_AMBULATORY_CARE_PROVIDER_SITE_OTHER): Payer: Medicare Other | Admitting: Internal Medicine

## 2017-04-08 VITALS — BP 122/78 | HR 68 | Temp 98.1°F | Resp 16 | Ht 70.0 in | Wt 185.2 lb

## 2017-04-08 DIAGNOSIS — B353 Tinea pedis: Secondary | ICD-10-CM | POA: Diagnosis not present

## 2017-04-08 DIAGNOSIS — I10 Essential (primary) hypertension: Secondary | ICD-10-CM | POA: Diagnosis not present

## 2017-04-08 MED ORDER — TERBINAFINE 1 % EX GEL: 1.0000 | Freq: Two times a day (BID) | CUTANEOUS | 11 refills | Status: AC

## 2017-04-08 MED ORDER — ZOSTER VAC RECOMB ADJUVANTED 50 MCG/0.5ML IM SUSR: 0.5000 mL | Freq: Once | INTRAMUSCULAR | 1 refills | Status: AC

## 2017-04-08 NOTE — Progress Notes (Signed)
Subjective:  Patient ID: Jose Gamble, male    DOB: [birth year]  Age: 76 y.o. MRN: [id number]  CC: Rash and Hypertension   HPI Jose Gamble presents for a blood pressure check and concerns about an itchy rash on his feet. He has had this for several months and has treated it somewhat successfully with OTC Tinactin. He notices the rash on the soles of his feet as well as in the web spaces. He otherwise feels well and offers no other complaints today.  Outpatient Medications Prior to Visit  Medication Sig Dispense Refill  . atorvastatin (LIPITOR) 10 MG tablet Take 1 tablet (10 mg total) by mouth daily. 90 tablet 3  . chlorthalidone (HYGROTON) 25 MG tablet Take 1 tablet (25 mg total) by mouth daily. 90 tablet 1  . polyethylene glycol (MIRALAX / GLYCOLAX) packet Take 17 g by mouth 2 (two) times daily. 14 each 0  . docusate sodium (COLACE) 100 MG capsule Take 1 capsule (100 mg total) by mouth 2 (two) times daily. 10 capsule 0  . DUREZOL 0.05 % EMUL Place 1 drop into both eyes 2 (two) times daily.    . ferrous sulfate (FERROUSUL) 325 (65 FE) MG tablet Take 1 tablet (325 mg total) by mouth 3 (three) times daily with meals.    Marland Kitchen HYDROcodone-acetaminophen (NORCO) 7.5-325 MG tablet Take 1-2 tablets by mouth every 4 (four) hours as needed for moderate pain or severe pain. 60 tablet 0  . methocarbamol (ROBAXIN) 500 MG tablet Take 1 tablet (500 mg total) by mouth every 6 (six) hours as needed for muscle spasms. 40 tablet 0  . ofloxacin (OCUFLOX) 0.3 % ophthalmic solution Place 1 drop into both eyes 2 (two) times daily.     No facility-administered medications prior to visit.     ROS Review of Systems  Constitutional: Negative.  Negative for diaphoresis, fatigue and fever.  HENT: Negative.   Eyes: Negative for visual disturbance.  Respiratory: Negative.  Negative for cough, chest tightness and shortness of breath.   Cardiovascular: Negative.  Negative for chest pain, palpitations and leg swelling.    Gastrointestinal: Negative.  Negative for abdominal pain, diarrhea and vomiting.  Endocrine: Negative.   Genitourinary: Negative.   Musculoskeletal: Negative.  Negative for back pain and myalgias.  Skin: Positive for rash.  Allergic/Immunologic: Negative.   Neurological: Negative.   Hematological: Negative for adenopathy. Does not bruise/bleed easily.  Psychiatric/Behavioral: Negative.     Objective:  BP 122/78 (BP Location: Left Arm, Patient Position: Sitting, Cuff Size: Normal)   Pulse 68   Temp 98.1 F (36.7 C) (Oral)   Resp 16   Ht 5\' 10"  (1.778 m)   Wt 185 lb 4 oz (84 kg)   SpO2 98%   BMI 26.58 kg/m   BP Readings from Last 3 Encounters:  04/08/17 122/78  12/23/16 119/71  12/11/16 127/77    Wt Readings from Last 3 Encounters:  04/08/17 185 lb 4 oz (84 kg)  12/22/16 182 lb (82.6 kg)  12/11/16 182 lb (82.6 kg)    Physical Exam  Constitutional: He is oriented to person, place, and time. No distress.  HENT:  Mouth/Throat: Oropharynx is clear and moist. No oropharyngeal exudate.  Eyes: Conjunctivae are normal. Right eye exhibits no discharge. Left eye exhibits no discharge. No scleral icterus.  Neck: Neck supple. No JVD present. No thyromegaly present.  Cardiovascular: Normal rate and regular rhythm.  Exam reveals no gallop.   No murmur heard. Pulmonary/Chest: Effort normal. No  respiratory distress. He has no wheezes. He has no rales.  Abdominal: Soft. Bowel sounds are normal. He exhibits no distension and no mass. There is no rebound and no guarding.  Musculoskeletal: Normal range of motion. He exhibits no edema, tenderness or deformity.  Lymphadenopathy:    He has no cervical adenopathy.  Neurological: He is alert and oriented to person, place, and time.  Skin: Skin is warm and dry. Rash noted. He is not diaphoretic. No erythema. No pallor.  Over the soles of the feet there is scaling and papules that is also found in the webspaces  Vitals reviewed.   Lab  Results  Component Value Date   WBC 17.3 (H) 12/23/2016   HGB 11.8 (L) 12/23/2016   HCT 34.5 (L) 12/23/2016   PLT 171 12/23/2016   GLUCOSE 144 (H) 12/23/2016   CHOL 166 08/27/2016   TRIG 122.0 08/27/2016   HDL 44.10 08/27/2016   LDLCALC 97 08/27/2016   ALT 13 08/27/2016   AST 16 08/27/2016   NA 140 12/23/2016   K 3.4 (L) 12/23/2016   CL 107 12/23/2016   CREATININE 0.89 12/23/2016   BUN 23 (H) 12/23/2016   CO2 26 12/23/2016   TSH 1.17 08/27/2016   PSA 0.86 08/27/2016   HGBA1C 5.8 03/07/2010    No results found.  Assessment & Plan:   Traci was seen today for rash and hypertension.  Diagnoses and all orders for this visit:  Essential hypertension- his blood pressure is adequately well controlled  Tinea pedis of both feet -     Terbinafine 1 % GEL; Apply 1 Act topically 2 (two) times daily.  Other orders -     Zoster Vac Recomb Adjuvanted [facility]) injection; Inject 0.5 mLs into the muscle once.   I have discontinued Mr. Hartwell DUREZOL, ofloxacin, docusate sodium, ferrous sulfate, methocarbamol, and HYDROcodone-acetaminophen. I am also having him start on Terbinafine and Zoster Vac Recomb Adjuvanted. Additionally, I am having him maintain his atorvastatin, chlorthalidone, and polyethylene glycol.  Meds ordered this encounter  Medications  . Terbinafine 1 % GEL    Sig: Apply 1 Act topically 2 (two) times daily.    Dispense:  12 g    Refill:  11  . Zoster Vac Recomb Adjuvanted (SHINGRIX) injection    Sig: Inject 0.5 mLs into the muscle once.    Dispense:  1 each    Refill:  1     Follow-up: Return if symptoms worsen or fail to improve.  Scarlette Calico, MD

## 2017-04-08 NOTE — Patient Instructions (Signed)
Athlete's Foot Athlete's foot (tinea pedis) is a fungal infection of the skin on the feet. It often occurs on the skin that is between or underneath the toes. It can also occur on the soles of the feet. The infection can spread from person to person (is contagious). Follow these instructions at home:  Apply or take over-the-counter and prescription medicines only as told by your doctor.  Keep all follow-up visits as told by your doctor. This is important.  Do not scratch your feet.  Keep your feet dry: ? Wear cotton or wool socks. Change your socks every day or if they become wet. ? Wear shoes that allow air to move around, such as sandals or canvas tennis shoes.  Wash and dry your feet: ? Every day or as told by your doctor. ? After exercising. ? Including the area between your toes.  Wear sandals in wet areas, such as locker rooms and shared showers.  Do not share any of these items: ? Towels. ? Nail clippers. ? Other personal items that touch your feet.  If you have diabetes, keep your blood sugar under control. Contact a doctor if:  You have a fever.  You have swelling, soreness, warmth, or redness in your foot.  You are not getting better with treatment.  Your symptoms get worse.  You have new symptoms. This information is not intended to replace advice given to you by your health care provider. Make sure you discuss any questions you have with your health care provider. Document Released: 03/31/2008 Document Revised: 03/20/2016 Document Reviewed: 04/16/2015 Elsevier Interactive Patient Education  2018 [employer]  

## 2017-05-07 DIAGNOSIS — Z961 Presence of intraocular lens: Secondary | ICD-10-CM | POA: Diagnosis not present

## 2017-07-13 ENCOUNTER — Other Ambulatory Visit: Payer: Self-pay | Admitting: Internal Medicine

## 2017-07-13 DIAGNOSIS — I10 Essential (primary) hypertension: Secondary | ICD-10-CM

## 2017-11-05 ENCOUNTER — Other Ambulatory Visit: Payer: Self-pay | Admitting: Internal Medicine

## 2017-11-05 DIAGNOSIS — E785 Hyperlipidemia, unspecified: Secondary | ICD-10-CM

## 2018-03-04 ENCOUNTER — Other Ambulatory Visit: Payer: Self-pay | Admitting: Internal Medicine

## 2018-03-04 DIAGNOSIS — I10 Essential (primary) hypertension: Secondary | ICD-10-CM

## 2018-03-27 ENCOUNTER — Other Ambulatory Visit: Payer: Self-pay | Admitting: Internal Medicine

## 2018-03-27 DIAGNOSIS — I10 Essential (primary) hypertension: Secondary | ICD-10-CM

## 2018-05-18 DIAGNOSIS — Z961 Presence of intraocular lens: Secondary | ICD-10-CM | POA: Diagnosis not present

## 2018-05-18 DIAGNOSIS — H524 Presbyopia: Secondary | ICD-10-CM | POA: Diagnosis not present

## 2018-05-20 ENCOUNTER — Encounter: Payer: Self-pay | Admitting: Internal Medicine

## 2018-05-20 ENCOUNTER — Ambulatory Visit (INDEPENDENT_AMBULATORY_CARE_PROVIDER_SITE_OTHER): Payer: Medicare Other | Admitting: Internal Medicine

## 2018-05-20 ENCOUNTER — Other Ambulatory Visit (INDEPENDENT_AMBULATORY_CARE_PROVIDER_SITE_OTHER): Payer: Medicare Other

## 2018-05-20 VITALS — BP 130/80 | HR 52 | Temp 98.3°F | Resp 16 | Ht 70.0 in | Wt 184.2 lb

## 2018-05-20 DIAGNOSIS — E785 Hyperlipidemia, unspecified: Secondary | ICD-10-CM

## 2018-05-20 DIAGNOSIS — I10 Essential (primary) hypertension: Secondary | ICD-10-CM

## 2018-05-20 DIAGNOSIS — R351 Nocturia: Secondary | ICD-10-CM

## 2018-05-20 DIAGNOSIS — N401 Enlarged prostate with lower urinary tract symptoms: Secondary | ICD-10-CM | POA: Diagnosis not present

## 2018-05-20 DIAGNOSIS — Z0000 Encounter for general adult medical examination without abnormal findings: Secondary | ICD-10-CM | POA: Diagnosis not present

## 2018-05-20 DIAGNOSIS — Z23 Encounter for immunization: Secondary | ICD-10-CM | POA: Diagnosis not present

## 2018-05-20 DIAGNOSIS — R001 Bradycardia, unspecified: Secondary | ICD-10-CM | POA: Diagnosis not present

## 2018-05-20 LAB — COMPREHENSIVE METABOLIC PANEL
ALT: 12 U/L (ref 0–53)
AST: 15 U/L (ref 0–37)
Albumin: 4.1 g/dL (ref 3.5–5.2)
Alkaline Phosphatase: 55 U/L (ref 39–117)
BILIRUBIN TOTAL: 0.5 mg/dL (ref 0.2–1.2)
BUN: 17 mg/dL (ref 6–23)
CHLORIDE: 106 meq/L (ref 96–112)
CO2: 30 meq/L (ref 19–32)
CREATININE: 0.87 mg/dL (ref 0.40–1.50)
Calcium: 8.8 mg/dL (ref 8.4–10.5)
GFR: 90.73 mL/min (ref >60.00)
GLUCOSE: 93 mg/dL (ref 70–99)
Potassium: 4.2 mEq/L (ref 3.5–5.1)
SODIUM: 142 meq/L (ref 135–145)
Total Protein: 6.4 g/dL (ref 6.0–8.3)

## 2018-05-20 LAB — URINALYSIS, ROUTINE W REFLEX MICROSCOPIC
BILIRUBIN URINE: NEGATIVE
HGB URINE DIPSTICK: NEGATIVE
Ketones, ur: NEGATIVE
Leukocytes, UA: NEGATIVE
NITRITE: NEGATIVE
RBC / HPF: NONE SEEN (ref 0–?)
Specific Gravity, Urine: 1.025 (ref 1.000–1.030)
TOTAL PROTEIN, URINE-UPE24: NEGATIVE
Urine Glucose: NEGATIVE
Urobilinogen, UA: 0.2 (ref 0.0–1.0)
pH: 6 (ref 5.0–8.0)

## 2018-05-20 LAB — LIPID PANEL
CHOL/HDL RATIO: 4
Cholesterol: 139 mg/dL (ref 0–200)
HDL: 39.3 mg/dL (ref >39.00)
LDL CALC: 81 mg/dL (ref 0–99)
NONHDL: 99.32
Triglycerides: 92 mg/dL (ref 0.0–149.0)
VLDL: 18.4 mg/dL (ref 0.0–40.0)

## 2018-05-20 LAB — CBC WITH DIFFERENTIAL/PLATELET
BASOS ABS: 0 10*3/uL (ref 0.0–0.1)
Basophils Relative: 0.5 % (ref 0.0–3.0)
Eosinophils Absolute: 0.2 10*3/uL (ref 0.0–0.7)
Eosinophils Relative: 3.6 % (ref 0.0–5.0)
HEMATOCRIT: 42.7 % (ref 39.0–52.0)
Hemoglobin: 14 g/dL (ref 13.0–17.0)
LYMPHS PCT: 22.1 % (ref 12.0–46.0)
Lymphs Abs: 1.5 10*3/uL (ref 0.7–4.0)
MCHC: 32.8 g/dL (ref 30.0–36.0)
MCV: 91.1 fl (ref 78.0–100.0)
MONOS PCT: 12.7 % — AB (ref 3.0–12.0)
Monocytes Absolute: 0.9 10*3/uL (ref 0.1–1.0)
NEUTROS ABS: 4.2 10*3/uL (ref 1.4–7.7)
Neutrophils Relative %: 61.1 % (ref 43.0–77.0)
PLATELETS: 173 10*3/uL (ref 150.0–400.0)
RBC: 4.69 Mil/uL (ref 4.22–5.81)
RDW: 15 % (ref 11.5–15.5)
WBC: 6.8 10*3/uL (ref 4.0–10.5)

## 2018-05-20 LAB — PSA: PSA: 0.93 ng/mL (ref 0.10–4.00)

## 2018-05-20 LAB — TSH: TSH: 1.6 u[IU]/mL (ref 0.35–4.50)

## 2018-05-20 MED ORDER — ATORVASTATIN CALCIUM 10 MG PO TABS: 10.0000 mg | ORAL_TABLET | Freq: Every day | ORAL | 1 refills | Status: DC

## 2018-05-20 NOTE — Progress Notes (Signed)
Subjective:  Patient ID: Jose Gamble, male    DOB: [birth year]  Age: 77 y.o. MRN: [id number]  CC: Anemia; Hypertension; Hyperlipidemia; and Annual Exam   HPI Jose Gamble presents for a CPX.  He ran out of chlorthalidone about 3 weeks ago and tells me his blood pressure has been well controlled.  He is very active and denies any recent episodes of DOE, CP, palpitations, edema, or fatigue.  He also ran out of the statin and has not been taking it.  Past Medical History:  Diagnosis Date  . Arthritis   . History of kidney stones   . Hypertension    Past Surgical History:  Procedure Laterality Date  . [place] cyst removed from tongue     . left knee arthroscopy     . TOTAL KNEE ARTHROPLASTY Right 12/22/2016   Procedure: RIGHT TOTAL KNEE ARTHROPLASTY;  Surgeon: Paralee Cancel, MD;  Location: [facility];  Service: Orthopedics;  Laterality: Right;  Requests 90 mins; Adductor Block    reports that he has quit smoking. He has quit using smokeless tobacco. He reports that he does not drink alcohol or use drugs. family history includes Alcohol abuse in his brother; Cancer (age of onset: 55) in his brother; Cancer (age of onset: 81) in his father; Early death in his brother. Allergies  Allergen Reactions  . Diovan [Valsartan] Other (See Comments)    Severe fatigue    Outpatient Medications Prior to Visit  Medication Sig Dispense Refill  . polyethylene glycol (MIRALAX / GLYCOLAX) packet Take 17 g by mouth 2 (two) times daily. (Patient not taking: Reported on 05/20/2018) 14 each 0  . atorvastatin (LIPITOR) 10 MG tablet Take 1 tablet (10 mg total) by mouth daily. (Patient not taking: Reported on 05/20/2018) 90 tablet 3  . chlorthalidone (HYGROTON) 25 MG tablet TAKE 1 TABLET BY MOUTH DAILY (Patient not taking: Reported on 05/20/2018) 30 tablet 0   No facility-administered medications prior to visit.     ROS Review of Systems  Constitutional: Negative.  Negative for diaphoresis, fatigue and  unexpected weight change.  HENT: Negative.   Eyes: Negative for visual disturbance.  Respiratory: Negative for apnea, choking, shortness of breath and wheezing.   Cardiovascular: Negative.  Negative for chest pain, palpitations and leg swelling.  Gastrointestinal: Negative for abdominal pain, constipation, diarrhea, nausea and vomiting.  Endocrine: Negative.   Genitourinary: Negative.  Negative for difficulty urinating, dysuria, penile swelling, scrotal swelling, testicular pain and urgency.  Musculoskeletal: Negative.  Negative for arthralgias, myalgias and neck pain.  Skin: Negative.   Neurological: Negative.  Negative for dizziness, weakness, light-headedness and headaches.  Hematological: Negative for adenopathy. Does not bruise/bleed easily.  Psychiatric/Behavioral: Negative.     Objective:  BP 130/80 (BP Location: Left Arm, Patient Position: Sitting, Cuff Size: Normal)   Pulse (!) 52   Temp 98.3 F (36.8 C) (Oral)   Resp 16   Ht 5\' 10"  (1.778 m)   Wt 184 lb 4 oz (83.6 kg)   SpO2 96%   BMI 26.44 kg/m   BP Readings from Last 3 Encounters:  05/20/18 130/80  04/08/17 122/78  12/23/16 119/71    Wt Readings from Last 3 Encounters:  05/20/18 184 lb 4 oz (83.6 kg)  04/08/17 185 lb 4 oz (84 kg)  12/22/16 182 lb (82.6 kg)    Physical Exam  Constitutional: He is oriented to person, place, and time. No distress.  HENT:  Nose: Nose normal.  Mouth/Throat: No oropharyngeal exudate.  Eyes: Conjunctivae are normal. No scleral icterus.  Neck: Normal range of motion. Neck supple. No JVD present. No thyromegaly present.  Cardiovascular: Regular rhythm, S1 normal and normal heart sounds. Bradycardia present. Exam reveals no gallop.  No murmur heard. EKG ----  Sinus  Bradycardia  WITHIN NORMAL LIMITS- no change compared to the prior EKG  Pulmonary/Chest: Effort normal and breath sounds normal. He has no wheezes. He has no rales.  Abdominal: Soft. Normal appearance and bowel  sounds are normal. There is no hepatosplenomegaly. There is no tenderness. No hernia. Hernia confirmed negative in the right inguinal area and confirmed negative in the left inguinal area.  Genitourinary: Rectum normal, testes normal and penis normal. Rectal exam shows no external hemorrhoid, no internal hemorrhoid, no fissure, no mass, no tenderness, anal tone normal and guaiac negative stool. Prostate is enlarged (1+ smooth symm BPH). Prostate is not tender. Right testis shows no mass, no swelling and no tenderness. Left testis shows no mass, no swelling and no tenderness. Uncircumcised. No phimosis, paraphimosis, hypospadias, penile erythema or penile tenderness. No discharge found.  Musculoskeletal: Normal range of motion. He exhibits no edema, tenderness or deformity.  Lymphadenopathy:    He has no cervical adenopathy. No inguinal adenopathy noted on the right or left side.  Neurological: He is alert and oriented to person, place, and time.  Skin: Skin is warm and dry. No rash noted. He is not diaphoretic.  Vitals reviewed.   Lab Results  Component Value Date   WBC 6.8 05/20/2018   HGB 14.0 05/20/2018   HCT 42.7 05/20/2018   PLT 173.0 05/20/2018   GLUCOSE 93 05/20/2018   CHOL 139 05/20/2018   TRIG 92.0 05/20/2018   HDL 39.30 05/20/2018   LDLCALC 81 05/20/2018   ALT 12 05/20/2018   AST 15 05/20/2018   NA 142 05/20/2018   K 4.2 05/20/2018   CL 106 05/20/2018   CREATININE 0.87 05/20/2018   BUN 17 05/20/2018   CO2 30 05/20/2018   TSH 1.60 05/20/2018   PSA 0.93 05/20/2018   HGBA1C 5.8 03/07/2010    No results found.  Assessment & Plan:   Jose Gamble was seen today for anemia, hypertension, hyperlipidemia and annual exam.  Diagnoses and all orders for this visit:  Essential hypertension- His blood pressure is adequately well controlled with no medications.  His labs are negative for secondary causes or endorgan damage. -     CBC with Differential/Platelet; Future -     TSH;  Future -     Urinalysis, Routine w reflex microscopic; Future  Hyperlipidemia with target LDL less than 100-he has not achieved his LDL goal and has an elevated ASCVD risk score.  I have asked him to restart the statin for CV risk reduction. -     Lipid panel; Future -     Comprehensive metabolic panel; Future -     TSH; Future -     atorvastatin (LIPITOR) 10 MG tablet; Take 1 tablet (10 mg total) by mouth daily.  BPH associated with nocturia- His PSA remains low which is reassuring that he does not have prostate cancer.  He has no symptoms that need to be treated. -     Urinalysis, Routine w reflex microscopic; Future -     PSA; Future  Need for Tdap vaccination -     Tdap vaccine greater than or equal to 7yo IM  Need for pneumococcal vaccination -     Pneumococcal polysaccharide vaccine 23-valent greater than or equal  to 2yo subcutaneous/IM  Bradycardia- This is a stable and asymptomatic finding for him.  Labs are negative for secondary causes.  No intervention is needed at this time. -     EKG 12-Lead   I have discontinued [name]. Jose Gamble's chlorthalidone. I am also having him maintain his polyethylene glycol and atorvastatin.  Meds ordered this encounter  Medications  . atorvastatin (LIPITOR) 10 MG tablet    Sig: Take 1 tablet (10 mg total) by mouth daily.    Dispense:  90 tablet    Refill:  1   See AVS for instructions about healthy living and anticipatory guidance.  Follow-up: Return in about 6 months (around 11/20/2018).  Scarlette Calico, MD

## 2018-05-20 NOTE — Patient Instructions (Signed)

## 2018-05-21 NOTE — Assessment & Plan Note (Signed)

## 2019-03-24 ENCOUNTER — Other Ambulatory Visit: Payer: Self-pay | Admitting: Internal Medicine

## 2019-03-24 DIAGNOSIS — E785 Hyperlipidemia, unspecified: Secondary | ICD-10-CM

## 2019-05-23 DIAGNOSIS — L720 Epidermal cyst: Secondary | ICD-10-CM | POA: Diagnosis not present

## 2019-05-23 DIAGNOSIS — H524 Presbyopia: Secondary | ICD-10-CM | POA: Diagnosis not present

## 2019-05-23 DIAGNOSIS — Z961 Presence of intraocular lens: Secondary | ICD-10-CM | POA: Diagnosis not present

## 2019-06-28 DIAGNOSIS — K1329 Other disturbances of oral epithelium, including tongue: Secondary | ICD-10-CM | POA: Diagnosis not present

## 2019-07-14 NOTE — Progress Notes (Addendum)
Subjective:   Jose Gamble is a 78 y.o. male who presents for Medicare Annual/Subsequent preventive examination. I connected with patient by a telephone and verified that I am speaking with the correct person using two identifiers. Patient stated full name and DOB. Patient gave permission to continue with telephonic visit. Patient's location was at home and Nurse's location was at [place] office.   Review of Systems:   Cardiac Risk Factors include: dyslipidemia;male gender;hypertension Sleep patterns: feels rested on waking, gets up 1-2 times nightly to void and sleeps 6-7hours nightly.   Home Safety/Smoke Alarms: Feels safe in home. Smoke alarms in place.  Living environment; residence and Firearm Safety: 1-story house/ trailer. Lives alone, no needs for DME, good support system Seat Belt Safety/Bike Helmet: Wears seat belt.      Objective:    Vitals: There were no vitals taken for this visit.  There is no height or weight on file to calculate BMI.  Advanced Directives 07/15/2019 12/22/2016 12/22/2016 12/11/2016 08/28/2016  Does Patient Have a Medical Advance Directive? Yes Yes Yes Yes Yes  Type of Paramedic of [place];Living will [place];Living will [place];Living will [place];Living will [place];Living will  Does patient want to make changes to medical advance directive? - No - Patient declined - - No - Patient declined  Copy of [place] in Chart? No - copy requested No - copy requested No - copy requested - Yes    Tobacco Social History   Tobacco Use  Smoking Status Former Smoker  Smokeless Tobacco Former Systems developer     Counseling given: Not Answered  Past Medical History:  Diagnosis Date  . Arthritis   . History of kidney stones   . Hyperlipidemia   . Hypertension    Past Surgical History:  Procedure Laterality Date  . [place] cyst removed from tongue      . CATARACT EXTRACTION, BILATERAL Bilateral 10/2016  . left knee arthroscopy  Left   . TOTAL KNEE ARTHROPLASTY Right 12/22/2016   Procedure: RIGHT TOTAL KNEE ARTHROPLASTY;  Surgeon: Paralee Cancel, MD;  Location: [facility];  Service: Orthopedics;  Laterality: Right;  Requests 90 mins; Adductor Block   Family History  Problem Relation Age of Onset  . Cancer Father 46       prostate  . Cancer Brother 37       prostate  . Alcohol abuse Brother   . Early death Brother   . Heart disease Neg Hx   . Hyperlipidemia Neg Hx   . Hypertension Neg Hx   . Stroke Neg Hx   . Diabetes Neg Hx   . Depression Neg Hx   . COPD Neg Hx   . Asthma Neg Hx    Social History   Socioeconomic History  . Marital status: Widowed    Spouse name: Not on file  . Number of children: 2  . Years of education: Not on file  . Highest education level: Not on file  Occupational History  . Occupation: retired  Scientific laboratory technician  . Financial resource strain: Not hard at all  . Food insecurity    Worry: Never true    Inability: Never true  . Transportation needs    Medical: No    Non-medical: No  Tobacco Use  . Smoking status: Former Research scientist (life sciences)  . Smokeless tobacco: Former Network engineer and Sexual Activity  . Alcohol use: No  . Drug use:  No  . Sexual activity: Yes  Lifestyle  . Physical activity    Days per week: 7 days    Minutes per session: 70 min  . Stress: Not at all  Relationships  . Social connections    Talks on phone: More than three times a week    Gets together: More than three times a week    Attends religious service: 1 to 4 times per year    Active member of club or organization: Yes    Attends meetings of clubs or organizations: More than 4 times per year    Relationship status: Widowed  Other Topics Concern  . Not on file  Social History Narrative  . Not on file    Outpatient Encounter Medications as of 07/15/2019  Medication Sig  . atorvastatin (LIPITOR) 10 MG tablet TAKE 1 TABLET BY  MOUTH DAILY  . naproxen sodium (ALEVE) 220 MG tablet Take 220 mg by mouth.  . [DISCONTINUED] polyethylene glycol (MIRALAX / GLYCOLAX) packet Take 17 g by mouth 2 (two) times daily. (Patient not taking: Reported on 05/20/2018)   No facility-administered encounter medications on file as of 07/15/2019.     Activities of Daily Living In your present state of health, do you have any difficulty performing the following activities: 07/15/2019  Hearing? N  Vision? N  Difficulty concentrating or making decisions? N  Walking or climbing stairs? N  Dressing or bathing? N  Doing errands, shopping? N  Preparing Food and eating ? N  Using the Toilet? N  In the past six months, have you accidently leaked urine? N  Do you have problems with loss of bowel control? N  Managing your Medications? N  Managing your Finances? N  Housekeeping or managing your Housekeeping? N  Some recent data might be hidden    Patient Care Team: [name], MD as PCP - General   Assessment:   This is a routine wellness examination for Jose Gamble. Physical assessment deferred to PCP.  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 eats a variety of fruits and vegetables daily, limits salt, fat/cholesterol, sugar,carbohydrates,caffeine, drinks 6-8 glasses of water daily.   Goals   None     Fall Risk Fall Risk  07/15/2019 05/21/2018 05/20/2018 08/28/2016  Falls in the past year? 0 No No No  Number falls in past yr: 0 - - -  Injury with Fall? 1 - - -     Depression Screen PHQ 2/9 Scores 07/15/2019  05/21/2018 05/20/2018 08/28/2016  PHQ - 2 Score 0 0 0 0    Cognitive Function       Ad8 score reviewed for issues:  Issues making decisions: no  Less interest in hobbies / activities: no  Repeats questions, stories (family complaining): no  Trouble using ordinary gadgets (microwave, computer, phone):no  Forgets the month or year: no  Mismanaging finances: no  Remembering appts: no  Daily problems with thinking and/or memory: no Ad8 score is= 0  Immunization History  Administered Date(s) Administered  . Influenza Split 08/02/2017  . Influenza, High Dose Seasonal PF 08/27/2016, 07/28/2018, 06/28/2019  . Pneumococcal Conjugate-13 08/27/2016  .  Pneumococcal Polysaccharide-23 05/20/2018  . Td 08/28/2007  . Tdap 05/20/2018   Screening Tests Health Maintenance  Topic Date Due  . TETANUS/TDAP  05/20/2028  . INFLUENZA VACCINE  Completed  . PNA vac Low Risk Adult  Completed      Plan:   Reviewed health maintenance screenings with patient today and relevant education, vaccines, and/or referrals were provided.   I have personally reviewed and noted the following in the patient's chart:   . Medical and social history . Use of alcohol, tobacco or illicit drugs  . Current medications and supplements . Functional ability and status . Nutritional status . Physical activity . Advanced directives . List of other physicians . Screenings to include cognitive, depression, and falls . Referrals and appointments  In addition, I have reviewed and discussed with patient certain preventive protocols, quality metrics, and best practice recommendations. A written personalized care plan for preventive services as well as general preventive health recommendations were provided to patient.     Michiel Cowboy, RN  07/15/2019   Medical screening examination/treatment/procedure(s) were performed by non-physician practitioner and as supervising provider I was immediately available for  consultation/collaboration.  I agree with above. Marrian Salvage, FNP

## 2019-07-15 ENCOUNTER — Ambulatory Visit (INDEPENDENT_AMBULATORY_CARE_PROVIDER_SITE_OTHER): Payer: Medicare Other | Admitting: *Deleted

## 2019-07-15 DIAGNOSIS — Z0000 Encounter for general adult medical examination without abnormal findings: Secondary | ICD-10-CM | POA: Diagnosis not present

## 2019-07-25 ENCOUNTER — Ambulatory Visit (INDEPENDENT_AMBULATORY_CARE_PROVIDER_SITE_OTHER): Payer: Medicare Other | Admitting: Internal Medicine

## 2019-07-25 ENCOUNTER — Encounter: Payer: Self-pay | Admitting: Internal Medicine

## 2019-07-25 ENCOUNTER — Other Ambulatory Visit (INDEPENDENT_AMBULATORY_CARE_PROVIDER_SITE_OTHER): Payer: Medicare Other

## 2019-07-25 ENCOUNTER — Other Ambulatory Visit: Payer: Self-pay

## 2019-07-25 VITALS — BP 170/90 | HR 55 | Temp 98.4°F | Ht 70.0 in | Wt 184.2 lb

## 2019-07-25 DIAGNOSIS — R351 Nocturia: Secondary | ICD-10-CM

## 2019-07-25 DIAGNOSIS — I10 Essential (primary) hypertension: Secondary | ICD-10-CM

## 2019-07-25 DIAGNOSIS — N401 Enlarged prostate with lower urinary tract symptoms: Secondary | ICD-10-CM

## 2019-07-25 DIAGNOSIS — E785 Hyperlipidemia, unspecified: Secondary | ICD-10-CM | POA: Diagnosis not present

## 2019-07-25 LAB — CBC WITH DIFFERENTIAL/PLATELET
Basophils Absolute: 0 10*3/uL (ref 0.0–0.1)
Basophils Relative: 0.4 % (ref 0.0–3.0)
Eosinophils Absolute: 0.2 10*3/uL (ref 0.0–0.7)
Eosinophils Relative: 3.6 % (ref 0.0–5.0)
HCT: 43.7 % (ref 39.0–52.0)
Hemoglobin: 14.7 g/dL (ref 13.0–17.0)
Lymphocytes Relative: 21.1 % (ref 12.0–46.0)
Lymphs Abs: 1.3 10*3/uL (ref 0.7–4.0)
MCHC: 33.6 g/dL (ref 30.0–36.0)
MCV: 92.6 fl (ref 78.0–100.0)
Monocytes Absolute: 0.8 10*3/uL (ref 0.1–1.0)
Monocytes Relative: 12.8 % — ABNORMAL HIGH (ref 3.0–12.0)
Neutro Abs: 3.7 10*3/uL (ref 1.4–7.7)
Neutrophils Relative %: 62.1 % (ref 43.0–77.0)
Platelets: 170 10*3/uL (ref 150.0–400.0)
RBC: 4.71 Mil/uL (ref 4.22–5.81)
RDW: 13.9 % (ref 11.5–15.5)
WBC: 6 10*3/uL (ref 4.0–10.5)

## 2019-07-25 LAB — URINALYSIS, ROUTINE W REFLEX MICROSCOPIC
Bilirubin Urine: NEGATIVE
Hgb urine dipstick: NEGATIVE
Ketones, ur: NEGATIVE
Leukocytes,Ua: NEGATIVE
Nitrite: NEGATIVE
RBC / HPF: NONE SEEN (ref 0–?)
Specific Gravity, Urine: 1.02 (ref 1.000–1.030)
Total Protein, Urine: NEGATIVE
Urine Glucose: NEGATIVE
Urobilinogen, UA: 0.2 (ref 0.0–1.0)
pH: 7 (ref 5.0–8.0)

## 2019-07-25 LAB — HEPATIC FUNCTION PANEL
ALT: 12 U/L (ref 0–53)
AST: 16 U/L (ref 0–37)
Albumin: 4.3 g/dL (ref 3.5–5.2)
Alkaline Phosphatase: 66 U/L (ref 39–117)
Bilirubin, Direct: 0.1 mg/dL (ref 0.0–0.3)
Total Bilirubin: 0.6 mg/dL (ref 0.2–1.2)
Total Protein: 6.4 g/dL (ref 6.0–8.3)

## 2019-07-25 LAB — BASIC METABOLIC PANEL
BUN: 16 mg/dL (ref 6–23)
CO2: 28 mEq/L (ref 19–32)
Calcium: 9.2 mg/dL (ref 8.4–10.5)
Chloride: 107 mEq/L (ref 96–112)
Creatinine, Ser: 0.86 mg/dL (ref 0.40–1.50)
GFR: 86.24 mL/min (ref >60.00)
Glucose, Bld: 82 mg/dL (ref 70–99)
Potassium: 4.2 mEq/L (ref 3.5–5.1)
Sodium: 142 mEq/L (ref 135–145)

## 2019-07-25 LAB — LIPID PANEL
Cholesterol: 115 mg/dL (ref 0–200)
HDL: 37.7 mg/dL — ABNORMAL LOW (ref >39.00)
LDL Cholesterol: 58 mg/dL (ref 0–99)
NonHDL: 77.09
Total CHOL/HDL Ratio: 3
Triglycerides: 94 mg/dL (ref 0.0–149.0)
VLDL: 18.8 mg/dL (ref 0.0–40.0)

## 2019-07-25 LAB — PSA: PSA: 0.74 ng/mL (ref 0.10–4.00)

## 2019-07-25 LAB — TSH: TSH: 1.64 u[IU]/mL (ref 0.35–4.50)

## 2019-07-25 LAB — VITAMIN D 25 HYDROXY (VIT D DEFICIENCY, FRACTURES): VITD: 46.1 ng/mL (ref 30.00–100.00)

## 2019-07-25 MED ORDER — ATORVASTATIN CALCIUM 10 MG PO TABS: 10.0000 mg | ORAL_TABLET | Freq: Every day | ORAL | 0 refills | Status: DC

## 2019-07-25 NOTE — Patient Instructions (Signed)

## 2019-07-25 NOTE — Progress Notes (Signed)
Subjective:  Patient ID: Jose Gamble, male    DOB: 11-02-1940  Age: 78 y.o. MRN: [id number]  CC: Annual Exam, Hypertension, and Hyperlipidemia   HPI Jose Gamble presents for f/up -he tells me his blood pressure at home is consistently 140-145/70-80.  He is active and denies any recent episodes of CP, DOE, headache, blurred vision, edema, or fatigue.  Outpatient Medications Prior to Visit  Medication Sig Dispense Refill  . naproxen sodium (ALEVE) 220 MG tablet Take 220 mg by mouth.    Marland Kitchen atorvastatin (LIPITOR) 10 MG tablet TAKE 1 TABLET BY MOUTH DAILY 90 tablet 1   No facility-administered medications prior to visit.     ROS Review of Systems  Constitutional: Negative for appetite change, diaphoresis, fatigue and unexpected weight change.  HENT: Negative.   Eyes: Negative.  Negative for visual disturbance.  Respiratory: Negative for cough, chest tightness, shortness of breath and wheezing.   Cardiovascular: Negative for chest pain, palpitations and leg swelling.  Gastrointestinal: Negative for abdominal pain, constipation, diarrhea, nausea and vomiting.  Endocrine: Negative.   Genitourinary: Negative for difficulty urinating, dysuria, hematuria and urgency.  Musculoskeletal: Negative for arthralgias and myalgias.  Skin: Negative for color change, pallor and rash.  Neurological: Negative.  Negative for dizziness, weakness, light-headedness and headaches.  Hematological: Negative for adenopathy. Does not bruise/bleed easily.  Psychiatric/Behavioral: Negative.     Objective:  BP (!) 170/90 (BP Location: Left Arm, Patient Position: Sitting, Cuff Size: Normal)   Pulse (!) 55   Temp 98.4 F (36.9 C) (Oral)   Ht 5\' 10"  (1.778 m)   Wt 184 lb 4 oz (83.6 kg)   SpO2 97%   BMI 26.44 kg/m   BP Readings from Last 3 Encounters:  07/25/19 (!) 170/90  05/20/18 130/80  04/08/17 122/78    Wt Readings from Last 3 Encounters:  07/25/19 184 lb 4 oz (83.6 kg)  05/20/18 184 lb 4 oz (83.6  kg)  04/08/17 185 lb 4 oz (84 kg)    Physical Exam Vitals signs reviewed.  Constitutional:      Appearance: Normal appearance.  HENT:     Nose: Nose normal.     Mouth/Throat:     Pharynx: Oropharynx is clear.  Eyes:     General: No scleral icterus.    Conjunctiva/sclera: Conjunctivae normal.  Neck:     Musculoskeletal: Normal range of motion and neck supple.  Cardiovascular:     Rate and Rhythm: Normal rate and regular rhythm.     Heart sounds: No murmur.     Comments: EKG---  Sinus  Bradycardia  -Prominent R(V1) -nonspecific.   BORDERLINE Pulmonary:     Effort: Pulmonary effort is normal.     Breath sounds: No stridor. No wheezing, rhonchi or rales.  Abdominal:     General: Abdomen is flat. Bowel sounds are normal. There is no distension.     Palpations: Abdomen is soft. There is no hepatomegaly, splenomegaly or mass.     Tenderness: There is no abdominal tenderness. There is no guarding.     Hernia: There is no hernia in the left inguinal area or right inguinal area.  Genitourinary:    Pubic Area: No rash.      Penis: Normal and uncircumcised. No phimosis, paraphimosis, hypospadias, erythema, tenderness, discharge, swelling or lesions.      Scrotum/Testes: Normal.        Right: Mass or tenderness not present.        Left: Mass, tenderness or swelling  not present.     Epididymis:     Right: Normal. Not inflamed or enlarged. No mass or tenderness.     Left: Normal. Not inflamed or enlarged. No mass or tenderness.     Prostate: Enlarged (2+ smooth symm BPH). Not tender and no nodules present.     Rectum: Normal. Guaiac result negative. No mass, tenderness, anal fissure, external hemorrhoid or internal hemorrhoid. Normal anal tone.  Musculoskeletal: Normal range of motion.     Right lower leg: No edema.     Left lower leg: No edema.  Lymphadenopathy:     Cervical: No cervical adenopathy.     Lower Body: No right inguinal adenopathy. No left inguinal adenopathy.  Skin:     General: Skin is warm and dry.  Neurological:     General: No focal deficit present.     Mental Status: He is alert and oriented to person, place, and time. Mental status is at baseline.  Psychiatric:        Mood and Affect: Mood normal.        Behavior: Behavior normal.     Lab Results  Component Value Date   WBC 6.0 07/25/2019   HGB 14.7 07/25/2019   HCT 43.7 07/25/2019   PLT 170.0 07/25/2019   GLUCOSE 82 07/25/2019   CHOL 115 07/25/2019   TRIG 94.0 07/25/2019   HDL 37.70 (L) 07/25/2019   LDLCALC 58 07/25/2019   ALT 12 07/25/2019   AST 16 07/25/2019   NA 142 07/25/2019   K 4.2 07/25/2019   CL 107 07/25/2019   CREATININE 0.86 07/25/2019   BUN 16 07/25/2019   CO2 28 07/25/2019   TSH 1.64 07/25/2019   PSA 0.74 07/25/2019   HGBA1C 5.8 03/07/2010    No results found.  Assessment & Plan:   Jose Gamble was seen today for annual exam, hypertension and hyperlipidemia.  Diagnoses and all orders for this visit:  Essential hypertension- His blood pressure is elevated.  His labs are negative for secondary causes or endorgan damage.  This may be whitecoat phenomenon.  He is not willing to start taking an antihypertensive.  He will continue to monitor his blood pressure and will return in 3 months for a blood pressure recheck. -     CBC with Differential/Platelet; Future -     Basic metabolic panel; Future -     Urinalysis, Routine w reflex microscopic; Future -     TSH; Future -     VITAMIN D 25 Hydroxy (Vit-D Deficiency, Fractures); Future -     EKG 12-Lead  BPH associated with nocturia- His PSA is low which is reassuring that he does not have prostate cancer.  He has no symptoms that need to be treated. -     PSA; Future  Hyperlipidemia with target LDL less than 100 - He has achieved his LDL goal and is doing well on the statin. -     Lipid panel; Future -     TSH; Future -     Hepatic function panel; Future -     atorvastatin (LIPITOR) 10 MG tablet; Take 1 tablet (10 mg  total) by mouth daily.   I have changed [name]. Zambito's atorvastatin. I am also having him maintain his naproxen sodium.  Meds ordered this encounter  Medications  . atorvastatin (LIPITOR) 10 MG tablet    Sig: Take 1 tablet (10 mg total) by mouth daily.    Dispense:  90 tablet    Refill:  0     Follow-up: Return in about 3 months (around 10/24/2019).  Scarlette Calico, MD

## 2019-12-10 ENCOUNTER — Ambulatory Visit: Payer: Medicare Other | Attending: Internal Medicine

## 2019-12-10 DIAGNOSIS — Z23 Encounter for immunization: Secondary | ICD-10-CM | POA: Insufficient documentation

## 2019-12-10 NOTE — Progress Notes (Signed)
   Covid-19 Vaccination Clinic  Name:  ABDINASIR LOUGHNEY    MRN: [id number] DOB: 1941/07/02  12/10/2019  Mr. Keener was observed post Covid-19 immunization for 15 minutes without incidence. He was provided with Vaccine Information Sheet and instruction to access the V-Safe system.   Mr. Rexroth was instructed to call 911 with any severe reactions post vaccine: Marland Kitchen Difficulty breathing  . Swelling of your face and throat  . A fast heartbeat  . A bad rash all over your body  . Dizziness and weakness    Immunizations Administered    Name Date Dose VIS Date Route   Pfizer COVID-19 Vaccine 12/10/2019  1:46 PM 0.3 mL 10/07/2019 Intramuscular   Manufacturer: [place]   Lot: [id number]   [place]: [id number]

## 2019-12-29 DIAGNOSIS — L432 Lichenoid drug reaction: Secondary | ICD-10-CM | POA: Diagnosis not present

## 2019-12-29 DIAGNOSIS — K1329 Other disturbances of oral epithelium, including tongue: Secondary | ICD-10-CM | POA: Diagnosis not present

## 2020-01-02 ENCOUNTER — Ambulatory Visit: Payer: Medicare Other | Attending: Internal Medicine

## 2020-01-02 DIAGNOSIS — Z23 Encounter for immunization: Secondary | ICD-10-CM | POA: Insufficient documentation

## 2020-01-02 NOTE — Progress Notes (Signed)
   Covid-19 Vaccination Clinic  Name:  Jose Gamble    MRN: [id number] DOB: 1941/10/05  01/02/2020  [name] was observed post Covid-19 immunization for 15 minutes without incident. He was provided with Vaccine Information Sheet and instruction to access the V-Safe system.   [name] was instructed to call 911 with any severe reactions post vaccine: Marland Kitchen Difficulty breathing  . Swelling of face and throat  . A fast heartbeat  . A bad rash all over body  . Dizziness and weakness   Immunizations Administered    Name Date Dose VIS Date Route   Pfizer COVID-19 Vaccine 01/02/2020 11:25 AM 0.3 mL 10/07/2019 Intramuscular   Manufacturer: [place]   Lot: [id number]   [place]: [id number]

## 2020-05-17 ENCOUNTER — Other Ambulatory Visit: Payer: Self-pay | Admitting: Internal Medicine

## 2020-05-17 DIAGNOSIS — E785 Hyperlipidemia, unspecified: Secondary | ICD-10-CM

## 2020-05-22 DIAGNOSIS — Z961 Presence of intraocular lens: Secondary | ICD-10-CM | POA: Diagnosis not present

## 2020-05-22 DIAGNOSIS — H524 Presbyopia: Secondary | ICD-10-CM | POA: Diagnosis not present

## 2020-05-22 DIAGNOSIS — L720 Epidermal cyst: Secondary | ICD-10-CM | POA: Diagnosis not present

## 2020-10-16 ENCOUNTER — Other Ambulatory Visit: Payer: Self-pay | Admitting: Internal Medicine

## 2020-10-16 DIAGNOSIS — E785 Hyperlipidemia, unspecified: Secondary | ICD-10-CM

## 2020-10-17 ENCOUNTER — Other Ambulatory Visit: Payer: Self-pay | Admitting: Internal Medicine

## 2020-10-17 DIAGNOSIS — E785 Hyperlipidemia, unspecified: Secondary | ICD-10-CM

## 2020-10-30 ENCOUNTER — Other Ambulatory Visit: Payer: Self-pay | Admitting: Internal Medicine

## 2020-10-30 DIAGNOSIS — E785 Hyperlipidemia, unspecified: Secondary | ICD-10-CM

## 2020-11-21 ENCOUNTER — Ambulatory Visit (INDEPENDENT_AMBULATORY_CARE_PROVIDER_SITE_OTHER): Payer: Medicare Other | Admitting: Internal Medicine

## 2020-11-21 ENCOUNTER — Other Ambulatory Visit: Payer: Self-pay

## 2020-11-21 ENCOUNTER — Encounter: Payer: Self-pay | Admitting: Internal Medicine

## 2020-11-21 VITALS — BP 126/84 | HR 68 | Temp 98.1°F | Ht 70.0 in | Wt 187.0 lb

## 2020-11-21 DIAGNOSIS — D696 Thrombocytopenia, unspecified: Secondary | ICD-10-CM

## 2020-11-21 DIAGNOSIS — Z0000 Encounter for general adult medical examination without abnormal findings: Secondary | ICD-10-CM | POA: Diagnosis not present

## 2020-11-21 DIAGNOSIS — E785 Hyperlipidemia, unspecified: Secondary | ICD-10-CM | POA: Diagnosis not present

## 2020-11-21 DIAGNOSIS — R001 Bradycardia, unspecified: Secondary | ICD-10-CM

## 2020-11-21 DIAGNOSIS — B3742 Candidal balanitis: Secondary | ICD-10-CM | POA: Diagnosis not present

## 2020-11-21 DIAGNOSIS — I10 Essential (primary) hypertension: Secondary | ICD-10-CM | POA: Diagnosis not present

## 2020-11-21 LAB — BASIC METABOLIC PANEL
BUN: 17 mg/dL (ref 6–23)
CO2: 29 mEq/L (ref 19–32)
Calcium: 9.2 mg/dL (ref 8.4–10.5)
Chloride: 106 mEq/L (ref 96–112)
Creatinine, Ser: 0.88 mg/dL (ref 0.40–1.50)
GFR: 82.48 mL/min (ref >60.00)
Glucose, Bld: 96 mg/dL (ref 70–99)
Potassium: 3.9 mEq/L (ref 3.5–5.1)
Sodium: 141 mEq/L (ref 135–145)

## 2020-11-21 LAB — CBC WITH DIFFERENTIAL/PLATELET
Basophils Absolute: 0 10*3/uL (ref 0.0–0.1)
Basophils Relative: 0.5 % (ref 0.0–3.0)
Eosinophils Absolute: 0.2 10*3/uL (ref 0.0–0.7)
Eosinophils Relative: 2.3 % (ref 0.0–5.0)
HCT: 43.5 % (ref 39.0–52.0)
Hemoglobin: 14.3 g/dL (ref 13.0–17.0)
Lymphocytes Relative: 20.1 % (ref 12.0–46.0)
Lymphs Abs: 1.4 10*3/uL (ref 0.7–4.0)
MCHC: 32.9 g/dL (ref 30.0–36.0)
MCV: 86.8 fl (ref 78.0–100.0)
Monocytes Absolute: 0.8 10*3/uL (ref 0.1–1.0)
Monocytes Relative: 11.2 % (ref 3.0–12.0)
Neutro Abs: 4.5 10*3/uL (ref 1.4–7.7)
Neutrophils Relative %: 65.9 % (ref 43.0–77.0)
Platelets: 136 10*3/uL — ABNORMAL LOW (ref 150.0–400.0)
RBC: 5.01 Mil/uL (ref 4.22–5.81)
RDW: 16.7 % — ABNORMAL HIGH (ref 11.5–15.5)
WBC: 6.7 10*3/uL (ref 4.0–10.5)

## 2020-11-21 LAB — HEPATIC FUNCTION PANEL
ALT: 11 U/L (ref 0–53)
AST: 15 U/L (ref 0–37)
Albumin: 4.2 g/dL (ref 3.5–5.2)
Alkaline Phosphatase: 65 U/L (ref 39–117)
Bilirubin, Direct: 0.1 mg/dL (ref 0.0–0.3)
Total Bilirubin: 0.4 mg/dL (ref 0.2–1.2)
Total Protein: 6.7 g/dL (ref 6.0–8.3)

## 2020-11-21 LAB — LIPID PANEL
Cholesterol: 165 mg/dL (ref 0–200)
HDL: 35.6 mg/dL — ABNORMAL LOW (ref >39.00)
LDL Cholesterol: 95 mg/dL (ref 0–99)
NonHDL: 129.04
Total CHOL/HDL Ratio: 5
Triglycerides: 168 mg/dL — ABNORMAL HIGH (ref 0.0–149.0)
VLDL: 33.6 mg/dL (ref 0.0–40.0)

## 2020-11-21 LAB — TSH: TSH: 2.29 u[IU]/mL (ref 0.35–4.50)

## 2020-11-21 MED ORDER — ATORVASTATIN CALCIUM 10 MG PO TABS: 10.0000 mg | ORAL_TABLET | Freq: Every day | ORAL | 1 refills | Status: DC

## 2020-11-21 MED ORDER — NYSTATIN 100000 UNIT/GM EX CREA: 1.0000 "application " | TOPICAL_CREAM | Freq: Two times a day (BID) | CUTANEOUS | 2 refills | Status: DC

## 2020-11-21 NOTE — Patient Instructions (Signed)

## 2020-11-21 NOTE — Progress Notes (Signed)
Subjective:  Patient ID: Jose Gamble, male    DOB: 31-May-1941  Age: 80 y.o. MRN: [id number]  CC: Hypertension, Hyperlipidemia, and Annual Exam  This visit occurred during the SARS-CoV-2 public health emergency.  Safety protocols were in place, including screening questions prior to the visit, additional usage of staff PPE, and extensive cleaning of exam room while observing appropriate contact time as indicated for disinfecting solutions.    HPI Jose Gamble presents for a CPX.  He complains of frequent urination (> 8 times/day and 3/night). He complains of a red area over his foreskin. He is active and denies CP/DOE/palpitations/edema/fatigue.  Outpatient Medications Prior to Visit  Medication Sig Dispense Refill  . naproxen sodium (ALEVE) 220 MG tablet Take 220 mg by mouth.    Marland Kitchen atorvastatin (LIPITOR) 10 MG tablet TAKE 1 TABLET BY MOUTH DAILY 90 tablet 0   No facility-administered medications prior to visit.    ROS Review of Systems  Constitutional: Negative.  Negative for diaphoresis and fatigue.  HENT: Negative.   Eyes: Negative.   Respiratory: Negative for cough, chest tightness, shortness of breath and wheezing.   Cardiovascular: Negative for chest pain, palpitations and leg swelling.  Gastrointestinal: Negative for abdominal pain, constipation, diarrhea, nausea and vomiting.  Endocrine: Negative.   Genitourinary: Positive for frequency. Negative for difficulty urinating, dysuria, hematuria, scrotal swelling, testicular pain and urgency.  Musculoskeletal: Negative.  Negative for arthralgias, back pain, myalgias and neck pain.  Skin: Negative.  Negative for color change.  Neurological: Negative.  Negative for dizziness, weakness, light-headedness and headaches.  Hematological: Negative for adenopathy. Does not bruise/bleed easily.  Psychiatric/Behavioral: Negative.     Objective:  BP 126/84   Pulse 68   Temp 98.1 F (36.7 C) (Oral)   Ht 5\' 10"  (1.778 m)   Wt 187 lb  (84.8 kg)   SpO2 95%   BMI 26.83 kg/m   BP Readings from Last 3 Encounters:  11/21/20 126/84  07/25/19 (!) 170/90  05/20/18 130/80    Wt Readings from Last 3 Encounters:  11/21/20 187 lb (84.8 kg)  07/25/19 184 lb 4 oz (83.6 kg)  05/20/18 184 lb 4 oz (83.6 kg)    Physical Exam Vitals reviewed.  Constitutional:      Appearance: Normal appearance.  HENT:     Nose: Nose normal.     Mouth/Throat:     Mouth: Mucous membranes are moist.  Eyes:     General: No scleral icterus.    Conjunctiva/sclera: Conjunctivae normal.  Cardiovascular:     Rate and Rhythm: Normal rate and regular rhythm.     Heart sounds: Normal heart sounds, S1 normal and S2 normal. No murmur heard. No gallop.      Comments: EKG- NSR, 65 bpm Normal EKG Pulmonary:     Effort: Pulmonary effort is normal.     Breath sounds: No stridor. No wheezing, rhonchi or rales.  Abdominal:     General: Abdomen is flat. Bowel sounds are normal. There is no distension.     Palpations: Abdomen is soft. There is no hepatomegaly, splenomegaly or mass.     Tenderness: There is no abdominal tenderness.     Hernia: No hernia is present.  Genitourinary:   Musculoskeletal:        General: No swelling. Normal range of motion.     Cervical back: Neck supple.     Right lower leg: No edema.     Left lower leg: No edema.  Lymphadenopathy:  Cervical: No cervical adenopathy.  Skin:    General: Skin is warm and dry.     Findings: Erythema and rash present.  Neurological:     General: No focal deficit present.     Mental Status: He is alert.  Psychiatric:        Mood and Affect: Mood normal.        Behavior: Behavior normal.     Lab Results  Component Value Date   WBC 6.7 11/21/2020   HGB 14.3 11/21/2020   HCT 43.5 11/21/2020   PLT 136.0 (L) 11/21/2020   GLUCOSE 96 11/21/2020   CHOL 165 11/21/2020   TRIG 168.0 (H) 11/21/2020   HDL 35.60 (L) 11/21/2020   LDLCALC 95 11/21/2020   ALT 11 11/21/2020   AST 15  11/21/2020   NA 141 11/21/2020   K 3.9 11/21/2020   CL 106 11/21/2020   CREATININE 0.88 11/21/2020   BUN 17 11/21/2020   CO2 29 11/21/2020   TSH 2.29 11/21/2020   PSA 0.74 07/25/2019   HGBA1C 5.8 03/07/2010    No results found.  Assessment & Plan:   Jose Gamble was seen today for hypertension, hyperlipidemia and annual exam.  Diagnoses and all orders for this visit:  Essential hypertension- His blood pressure is well controlled.  Electrolytes and renal function are normal. -     CBC with Differential/Platelet; Future -     Basic metabolic panel; Future -     Urinalysis, Routine w reflex microscopic; Future -     EKG 12-Lead -     CBC with Differential/Platelet -     Basic metabolic panel -     Urinalysis, Routine w reflex microscopic  Hyperlipidemia with target LDL less than 100- He has achieved his LDL goal and is doing well on the statin. -     atorvastatin (LIPITOR) 10 MG tablet; Take 1 tablet (10 mg total) by mouth daily. -     TSH; Future -     Hepatic function panel; Future -     Lipid panel; Future -     TSH -     Hepatic function panel -     Lipid panel  Candidal balanitis -     nystatin cream (MYCOSTATIN); Apply 1 application topically 2 (two) times daily.  Bradycardia- His heart rate is now normal now and he is asymptomatic.  Routine general medical examination at a health care facility- Exam completed, labs reviewed, vaccines reviewed and updated, no cancer screenings are indicated, patient education material was given.  Thrombocytopenia (Jose Gamble)- There is no history of bleeding or bruising.  I have asked that he return Jose Gamble return to have this rechecked and to be screened for B12 and folate deficiency.   I have changed Jose Gamble. Jose Gamble's atorvastatin. I am also having him start on nystatin cream. Additionally, I am having him maintain his naproxen sodium.  Meds ordered this encounter  Medications  . atorvastatin (LIPITOR) 10 MG tablet    Sig: Take 1 tablet (10 mg  total) by mouth daily.    Dispense:  90 tablet    Refill:  1  . nystatin cream (MYCOSTATIN)    Sig: Apply 1 application topically 2 (two) times daily.    Dispense:  30 g    Refill:  2     Follow-up: Return in about 6 months (around 05/21/2021).  Jose Calico, MD

## 2020-11-22 ENCOUNTER — Telehealth: Payer: Self-pay | Admitting: Internal Medicine

## 2020-11-22 DIAGNOSIS — D696 Thrombocytopenia, unspecified: Secondary | ICD-10-CM | POA: Insufficient documentation

## 2020-11-22 LAB — URINALYSIS, ROUTINE W REFLEX MICROSCOPIC
Bilirubin Urine: NEGATIVE
Hgb urine dipstick: NEGATIVE
Ketones, ur: NEGATIVE
Leukocytes,Ua: NEGATIVE
Nitrite: NEGATIVE
RBC / HPF: NONE SEEN (ref 0–?)
Specific Gravity, Urine: 1.02 (ref 1.000–1.030)
Total Protein, Urine: NEGATIVE
Urine Glucose: NEGATIVE
Urobilinogen, UA: 0.2 (ref 0.0–1.0)
WBC, UA: NONE SEEN (ref 0–?)
pH: 5.5 (ref 5.0–8.0)

## 2020-11-22 NOTE — Telephone Encounter (Signed)
Patient wondering if he needs to get his PSA checked since he hasn't got it checked since 2020 and he wants to make sure it hasn't changed. He also stated that he donated platelets about a week ago and is wondering if that could be a reason why his platelets are low. [phone]

## 2020-11-22 NOTE — Telephone Encounter (Signed)
I don't routinely check PSAs after the age of 81 That would not lower his platelet count  TJ

## 2020-11-23 NOTE — Telephone Encounter (Signed)
Called pt, LVM.   

## 2021-01-22 ENCOUNTER — Telehealth: Payer: Self-pay

## 2021-01-22 NOTE — Telephone Encounter (Signed)
Pt scheduled for 4/6 @ 2.20pm.

## 2021-01-22 NOTE — Telephone Encounter (Signed)
-----   Message from [name], MD sent at 01/22/2021 11:58 AM EDT ----- Regarding: FW: PLT Ask him to come in for a recheck of his PLT count  TJ  ----- Message ----- From: [name], MD Sent: 11/22/2020   7:55 AM EDT To: [name], MD Subject: PLT                                            recheck

## 2021-01-30 ENCOUNTER — Ambulatory Visit (INDEPENDENT_AMBULATORY_CARE_PROVIDER_SITE_OTHER): Payer: Medicare Other | Admitting: Internal Medicine

## 2021-01-30 ENCOUNTER — Other Ambulatory Visit: Payer: Self-pay

## 2021-01-30 ENCOUNTER — Encounter: Payer: Self-pay | Admitting: Internal Medicine

## 2021-01-30 VITALS — BP 126/72 | HR 60 | Temp 98.0°F | Ht 70.0 in | Wt 188.0 lb

## 2021-01-30 DIAGNOSIS — N401 Enlarged prostate with lower urinary tract symptoms: Secondary | ICD-10-CM

## 2021-01-30 DIAGNOSIS — D696 Thrombocytopenia, unspecified: Secondary | ICD-10-CM

## 2021-01-30 DIAGNOSIS — N481 Balanitis: Secondary | ICD-10-CM

## 2021-01-30 DIAGNOSIS — R351 Nocturia: Secondary | ICD-10-CM

## 2021-01-30 LAB — CBC WITH DIFFERENTIAL/PLATELET
Basophils Absolute: 0 10*3/uL (ref 0.0–0.1)
Basophils Relative: 0.4 % (ref 0.0–3.0)
Eosinophils Absolute: 0.3 10*3/uL (ref 0.0–0.7)
Eosinophils Relative: 3.8 % (ref 0.0–5.0)
HCT: 39.1 % (ref 39.0–52.0)
Hemoglobin: 12.9 g/dL — ABNORMAL LOW (ref 13.0–17.0)
Lymphocytes Relative: 22 % (ref 12.0–46.0)
Lymphs Abs: 1.6 10*3/uL (ref 0.7–4.0)
MCHC: 33.1 g/dL (ref 30.0–36.0)
MCV: 88 fl (ref 78.0–100.0)
Monocytes Absolute: 0.8 10*3/uL (ref 0.1–1.0)
Monocytes Relative: 10.8 % (ref 3.0–12.0)
Neutro Abs: 4.5 10*3/uL (ref 1.4–7.7)
Neutrophils Relative %: 63 % (ref 43.0–77.0)
Platelets: 176 10*3/uL (ref 150.0–400.0)
RBC: 4.44 Mil/uL (ref 4.22–5.81)
RDW: 14.3 % (ref 11.5–15.5)
WBC: 7.1 10*3/uL (ref 4.0–10.5)

## 2021-01-30 LAB — VITAMIN B12: Vitamin B-12: 334 pg/mL (ref 211–911)

## 2021-01-30 LAB — PSA: PSA: 0.78 ng/mL (ref 0.10–4.00)

## 2021-01-30 LAB — FOLATE: Folate: 10.4 ng/mL (ref >5.9)

## 2021-01-30 MED ORDER — METRONIDAZOLE 500 MG PO TABS: 2000.0000 mg | ORAL_TABLET | Freq: Once | ORAL | 0 refills | Status: AC

## 2021-01-30 NOTE — Patient Instructions (Signed)
[employer] 2018 (pp. [id number].e1). [place], [place]: Elsevier.">  Balanitis  Balanitis is swelling and irritation of the head of the penis (glans penis). Balanitis occurs most often among males who have not had their foreskin removed (uncircumcised). In uncircumcised males, the condition may also cause inflammation of the skin around the foreskin. Balanitis sometimes causes scarring of the penis or foreskin, which can require surgery. This condition may develop because of an infection or another medical condition. Untreated balanitis can increase the risk of penile cancer. What are the causes? Common causes of this condition include:  Poor personal hygiene, especially in uncircumcised males. Not cleaning the glans penis and foreskin well can result in a buildup of bacteria, viruses, and yeast, which can lead to infection and inflammation.  Irritation and lack of air flow due to fluid (smegma) that can build up on the glans penis. Other causes include:  Chemical irritation from products such as soaps or shower gels, especially those that have fragrance. Chemical irritation can also be caused by condoms, personal lubricants, petroleum jelly, spermicides, or fabric softeners.  Skin conditions, such as eczema, dermatitis, and psoriasis.  Allergies to medicines, such as tetracycline and sulfa drugs. What increases the risk? The following factors may make you more likely to develop this condition:  Being an uncircumcised male.  Having diabetes.  Having other medical conditions, including liver cirrhosis, congestive heart failure, or kidney disease.  Having infections, such as candidiasis, HPV (human papillomavirus), herpes simplex, gonorrhea, or syphilis.  Having a tight foreskin that is difficult to pull back (retract) past the glans penis.  Being severely obese. What are the signs or symptoms? Symptoms of this condition include:  Discharge from under the foreskin, and pain  or difficulty retracting the foreskin.  A bad smell or itchiness on the penis.  Tenderness, redness, and swelling of the glans penis.  A rash or sores on the glans penis or foreskin.  Inability to get an erection due to pain.  Difficulty urinating.  Scarring of the penis or foreskin, in some cases. How is this diagnosed? This condition may be diagnosed based on a physical exam and tests of a swab of discharge to check for bacterial or fungal infection. You may also have blood tests to check for:  Viruses that can cause balanitis.  A high blood sugar (glucose) level. This could be a sign of diabetes, which can increase the risk of balanitis. How is this treated? Treatment for balanitis depends on the cause. Treatment may include:  Improving personal hygiene. Your health care provider may recommend sitting in a bath of warm water that is deep enough to cover your hips and buttocks (sitz bath).  Taking medicines such as: ? Creams or ointments to reduce swelling (steroids) or to treat an infection. ? Antibiotic medicine. ? Antifungal medicine.  Having surgery to remove or cut the foreskin (circumcision). This may be done if you have scarring on the foreskin that makes it difficult to retract.  Controlling other medical problems that may be causing your condition or making it worse. Follow these instructions at home: Medicines  Take over-the-counter and prescription medicines only as told by your health care provider.  If you were prescribed an antibiotic medicine or a cream or ointment, use it as told by your health care provider. Do not stop using your medicine, cream, or ointment even if you start to feel better. General instructions  Do not have sex until the condition clears up, or until your health care  provider approves.  Keep your penis clean and dry. Take sitz baths as recommended by your health care provider.  Avoid products that irritate your skin or make symptoms  worse, such as soaps and shower gels that have fragrance.  Keep all follow-up visits as told by your health care provider. This is important. Contact a health care provider if:  Your symptoms get worse or do not improve with home care.  You develop chills or a fever.  You have trouble urinating.  You cannot retract your foreskin. Get help right away if:  You develop severe pain.  You are unable to urinate. Summary  Balanitis is inflammation of the head of the penis (glans penis) caused by irritation or infection. This condition is most common among uncircumcised males.  Balanitis causes pain, redness, and swelling of the glans penis.  Good personal hygiene is important.  Treatment may include improving personal hygiene and applying creams or ointments.  Contact a health care provider if your symptoms get worse or do not improve with home care. This information is not intended to replace advice given to you by your health care provider. Make sure you discuss any questions you have with your health care provider. Document Revised: 08/03/2019 Document Reviewed: 08/03/2019 Elsevier Patient Education  [place].

## 2021-01-30 NOTE — Progress Notes (Signed)
Subjective:  Patient ID: Jose Gamble, male    DOB: 11-May-1941  Age: 80 y.o. MRN: [id number]  CC: Follow-up  This visit occurred during the SARS-CoV-2 public health emergency.  Safety protocols were in place, including screening questions prior to the visit, additional usage of staff PPE, and extensive cleaning of exam room while observing appropriate contact time as indicated for disinfecting solutions.    HPI NUMAIR MASDEN presents for f/up -   He returns for follow-up on a Gamble platelet count.  He denies any bleeding or bruising.  He wants to have his PSA level rechecked.  He also complains that he has a red swollen area over his penis.  He has been treating this with topical nystatin without any improvement in the symptoms.  He tells me his girlfriend gets frequent vaginal infections.  Outpatient Medications Prior to Visit  Medication Sig Dispense Refill  . atorvastatin (LIPITOR) 10 MG tablet Take 1 tablet (10 mg total) by mouth daily. 90 tablet 1  . naproxen sodium (ALEVE) 220 MG tablet Take 220 mg by mouth.    . nystatin cream (MYCOSTATIN) Apply 1 application topically 2 (two) times daily. 30 g 2   No facility-administered medications prior to visit.    ROS Review of Systems  Constitutional: Negative for chills, diaphoresis, fatigue and fever.  HENT: Negative.   Eyes: Negative for visual disturbance.  Respiratory: Negative for cough, chest tightness, shortness of breath and wheezing.   Cardiovascular: Negative for chest pain, palpitations and leg swelling.  Gastrointestinal: Negative for abdominal pain, blood in stool, constipation, diarrhea, nausea and vomiting.  Endocrine: Negative.   Genitourinary: Positive for penile pain and penile swelling. Negative for difficulty urinating, genital sores, penile discharge and testicular pain.  Musculoskeletal: Negative.  Negative for arthralgias.  Skin: Negative for color change.  Neurological: Negative.  Negative for dizziness, weakness,  light-headedness and headaches.  Hematological: Negative for adenopathy. Does not bruise/bleed easily.  Psychiatric/Behavioral: Negative.     Objective:  BP 126/72   Pulse 60   Temp 98 F (36.7 C) (Oral)   Ht 5\' 10"  (1.778 m)   Wt 188 lb (85.3 kg)   SpO2 95%   BMI 26.98 kg/m   BP Readings from Last 3 Encounters:  01/30/21 126/72  11/21/20 126/84  07/25/19 (!) 170/90    Wt Readings from Last 3 Encounters:  01/30/21 188 lb (85.3 kg)  11/21/20 187 lb (84.8 kg)  07/25/19 184 lb 4 oz (83.6 kg)    Physical Exam Vitals reviewed.  Constitutional:      Appearance: Normal appearance.  HENT:     Nose: Nose normal.     Mouth/Throat:     Mouth: Mucous membranes are moist.  Eyes:     General: No scleral icterus.    Conjunctiva/sclera: Conjunctivae normal.  Cardiovascular:     Rate and Rhythm: Normal rate and regular rhythm.     Heart sounds: No murmur heard.   Pulmonary:     Effort: Pulmonary effort is normal.     Breath sounds: No stridor. No wheezing, rhonchi or rales.  Abdominal:     General: Abdomen is flat.     Tenderness: There is no abdominal tenderness. There is no guarding.  Genitourinary:    Pubic Area: Rash present.     Penis: Uncircumcised. Erythema and swelling present. No phimosis, paraphimosis, discharge or lesions.     Musculoskeletal:        General: Normal range of motion.  Cervical back: Neck supple.     Right lower leg: No edema.     Left lower leg: No edema.  Lymphadenopathy:     Cervical: No cervical adenopathy.  Skin:    General: Skin is warm.     Coloration: Skin is not jaundiced.     Findings: No erythema or rash.  Neurological:     General: No focal deficit present.     Mental Status: He is alert.     Lab Results  Component Value Date   WBC 7.1 01/30/2021   HGB 12.9 (L) 01/30/2021   HCT 39.1 01/30/2021   PLT 176.0 01/30/2021   GLUCOSE 96 11/21/2020   CHOL 165 11/21/2020   TRIG 168.0 (H) 11/21/2020   HDL 35.60 (L)  11/21/2020   LDLCALC 95 11/21/2020   ALT 11 11/21/2020   AST 15 11/21/2020   NA 141 11/21/2020   K 3.9 11/21/2020   CL 106 11/21/2020   CREATININE 0.88 11/21/2020   BUN 17 11/21/2020   CO2 29 11/21/2020   TSH 2.29 11/21/2020   PSA 0.78 01/30/2021   HGBA1C 5.8 03/07/2010    No results found.  Assessment & Plan:   Camry was seen today for follow-up.  Diagnoses and all orders for this visit:  Thrombocytopenia ([place])- His platelet count is normal now.  Screening for secondary causes is unremarkable.  He is mildly anemic now.  I will continue to follow this. -     CBC with Differential/Platelet; Future -     Vitamin B12; Future -     Folate; Future -     RPR; Future -     RPR -     Folate -     Vitamin B12 -     CBC with Differential/Platelet  Balanitis-we will treat for Gardnerella and trich with metronidazole. -     RPR; Future -     metroNIDAZOLE (FLAGYL) 500 MG tablet; Take 4 tablets (2,000 mg total) by mouth once for 1 dose. -     RPR  BPH associated with nocturia- His PSA is Gamble.  This is a reassuring sign that he does not have prostate cancer. -     PSA; Future -     PSA   I am having [name] start on metroNIDAZOLE. I am also having him maintain his naproxen sodium, atorvastatin, and nystatin cream.  Meds ordered this encounter  Medications  . metroNIDAZOLE (FLAGYL) 500 MG tablet    Sig: Take 4 tablets (2,000 mg total) by mouth once for 1 dose.    Dispense:  4 tablet    Refill:  0     Follow-up: Return in about 3 months (around 05/01/2021).  Scarlette Calico, MD

## 2021-01-31 LAB — RPR: RPR Ser Ql: NONREACTIVE

## 2021-05-27 ENCOUNTER — Other Ambulatory Visit: Payer: Self-pay | Admitting: Internal Medicine

## 2021-05-27 DIAGNOSIS — E785 Hyperlipidemia, unspecified: Secondary | ICD-10-CM

## 2021-06-04 DIAGNOSIS — Z961 Presence of intraocular lens: Secondary | ICD-10-CM | POA: Diagnosis not present

## 2021-06-04 DIAGNOSIS — H524 Presbyopia: Secondary | ICD-10-CM | POA: Diagnosis not present

## 2021-08-21 ENCOUNTER — Ambulatory Visit (INDEPENDENT_AMBULATORY_CARE_PROVIDER_SITE_OTHER): Payer: Medicare Other

## 2021-08-21 ENCOUNTER — Other Ambulatory Visit: Payer: Self-pay

## 2021-08-21 VITALS — BP 130/80 | HR 56 | Temp 98.2°F | Ht 70.0 in | Wt 180.8 lb

## 2021-08-21 DIAGNOSIS — Z0000 Encounter for general adult medical examination without abnormal findings: Secondary | ICD-10-CM

## 2021-08-21 NOTE — Patient Instructions (Signed)
Jose Gamble , Thank you for taking time to come for your Medicare Wellness Visit. I appreciate your ongoing commitment to your health goals. Please review the following plan we discussed and let me know if I can assist you in the future.   Screening recommendations/referrals: Colonoscopy: Not a candidate for screening due to age Recommended yearly ophthalmology/optometry visit for glaucoma screening and checkup Recommended yearly dental visit for hygiene and checkup  Vaccinations: Influenza vaccine: 07/2021 Pneumococcal vaccine: 05/20/2018, 07/28/2018 Tdap vaccine: 05/20/2018; due every 10 years Shingles vaccine: 07/28/2018, 09/27/2018   Covid-19: 12/10/2019, 01/02/2020  Advanced directives: Please bring a copy of your health care power of attorney and living will to the office at your convenience.  Conditions/risks identified: Yes; Client understands the importance of follow-up with providers by attending scheduled visits and discussed goals to eat healthier, increase physical activity, exercise the brain, socialize more, get enough sleep and make time for laughter.  Next appointment: Please schedule your next Medicare Wellness Visit with your Nurse Health Advisor in 1 year by calling [phone].  Preventive Care 16 Years and Older, Male Preventive care refers to lifestyle choices and visits with your health care provider that can promote health and wellness. What does preventive care include? A yearly physical exam. This is also called an annual well check. Dental exams once or twice a year. Routine eye exams. Ask your health care provider how often you should have your eyes checked. Personal lifestyle choices, including: Daily care of your teeth and gums. Regular physical activity. Eating a healthy diet. Avoiding tobacco and drug use. Limiting alcohol use. Practicing safe sex. Taking low doses of aspirin every day. Taking vitamin and mineral supplements as recommended by your health  care provider. What happens during an annual well check? The services and screenings done by your health care provider during your annual well check will depend on your age, overall health, lifestyle risk factors, and family history of disease. Counseling  Your health care provider may ask you questions about your: Alcohol use. Tobacco use. Drug use. Emotional well-being. Home and relationship well-being. Sexual activity. Eating habits. History of falls. Memory and ability to understand (cognition). Work and work Statistician. Screening  You may have the following tests or measurements: Height, weight, and BMI. Blood pressure. Lipid and cholesterol levels. These may be checked every 5 years, or more frequently if you are over 5 years old. Skin check. Lung cancer screening. You may have this screening every year starting at age 72 if you have a 30-pack-year history of smoking and currently smoke or have quit within the past 15 years. Fecal occult blood test (FOBT) of the stool. You may have this test every year starting at age 48. Flexible sigmoidoscopy or colonoscopy. You may have a sigmoidoscopy every 5 years or a colonoscopy every 10 years starting at age 25. Prostate cancer screening. Recommendations will vary depending on your family history and other risks. Hepatitis C blood test. Hepatitis B blood test. Sexually transmitted disease (STD) testing. Diabetes screening. This is done by checking your blood sugar (glucose) after you have not eaten for a while (fasting). You may have this done every 1-3 years. Abdominal aortic aneurysm (AAA) screening. You may need this if you are a current or former smoker. Osteoporosis. You may be screened starting at age 63 if you are at high risk. Talk with your health care provider about your test results, treatment options, and if necessary, the need for more tests. Vaccines  Your health care provider  may recommend certain vaccines, such  as: Influenza vaccine. This is recommended every year. Tetanus, diphtheria, and acellular pertussis (Tdap, Td) vaccine. You may need a Td booster every 10 years. Zoster vaccine. You may need this after age 69. Pneumococcal 13-valent conjugate (PCV13) vaccine. One dose is recommended after age 62. Pneumococcal polysaccharide (PPSV23) vaccine. One dose is recommended after age 45. Talk to your health care provider about which screenings and vaccines you need and how often you need them. This information is not intended to replace advice given to you by your health care provider. Make sure you discuss any questions you have with your health care provider. Document Released: 11/09/2015 Document Revised: 07/02/2016 Document Reviewed: 08/14/2015 Elsevier Interactive Patient Education  2017 [place] Prevention in the Home Falls can cause injuries. They can happen to people of all ages. There are many things you can do to make your home safe and to help prevent falls. What can I do on the outside of my home? Regularly fix the edges of walkways and driveways and fix any cracks. Remove anything that might make you trip as you walk through a door, such as a raised step or threshold. Trim any bushes or trees on the path to your home. Use bright outdoor lighting. Clear any walking paths of anything that might make someone trip, such as rocks or tools. Regularly check to see if handrails are loose or broken. Make sure that both sides of any steps have handrails. Any raised decks and porches should have guardrails on the edges. Have any leaves, snow, or ice cleared regularly. Use sand or salt on walking paths during winter. Clean up any spills in your garage right away. This includes oil or grease spills. What can I do in the bathroom? Use night lights. Install grab bars by the toilet and in the tub and shower. Do not use towel bars as grab bars. Use non-skid mats or decals in the tub or  shower. If you need to sit down in the shower, use a plastic, non-slip stool. Keep the floor dry. Clean up any water that spills on the floor as soon as it happens. Remove soap buildup in the tub or shower regularly. Attach bath mats securely with double-sided non-slip rug tape. Do not have throw rugs and other things on the floor that can make you trip. What can I do in the bedroom? Use night lights. Make sure that you have a light by your bed that is easy to reach. Do not use any sheets or blankets that are too big for your bed. They should not hang down onto the floor. Have a firm chair that has side arms. You can use this for support while you get dressed. Do not have throw rugs and other things on the floor that can make you trip. What can I do in the kitchen? Clean up any spills right away. Avoid walking on wet floors. Keep items that you use a lot in easy-to-reach places. If you need to reach something above you, use a strong step stool that has a grab bar. Keep electrical cords out of the way. Do not use floor polish or wax that makes floors slippery. If you must use wax, use non-skid floor wax. Do not have throw rugs and other things on the floor that can make you trip. What can I do with my stairs? Do not leave any items on the stairs. Make sure that there are handrails on both sides  of the stairs and use them. Fix handrails that are broken or loose. Make sure that handrails are as long as the stairways. Check any carpeting to make sure that it is firmly attached to the stairs. Fix any carpet that is loose or worn. Avoid having throw rugs at the top or bottom of the stairs. If you do have throw rugs, attach them to the floor with carpet tape. Make sure that you have a light switch at the top of the stairs and the bottom of the stairs. If you do not have them, ask someone to add them for you. What else can I do to help prevent falls? Wear shoes that: Do not have high heels. Have  rubber bottoms. Are comfortable and fit you well. Are closed at the toe. Do not wear sandals. If you use a stepladder: Make sure that it is fully opened. Do not climb a closed stepladder. Make sure that both sides of the stepladder are locked into place. Ask someone to hold it for you, if possible. Clearly mark and make sure that you can see: Any grab bars or handrails. First and last steps. Where the edge of each step is. Use tools that help you move around (mobility aids) if they are needed. These include: Canes. Walkers. Scooters. Crutches. Turn on the lights when you go into a dark area. Replace any light bulbs as soon as they burn out. Set up your furniture so you have a clear path. Avoid moving your furniture around. If any of your floors are uneven, fix them. If there are any pets around you, be aware of where they are. Review your medicines with your doctor. Some medicines can make you feel dizzy. This can increase your chance of falling. Ask your doctor what other things that you can do to help prevent falls. This information is not intended to replace advice given to you by your health care provider. Make sure you discuss any questions you have with your health care provider. Document Released: 08/09/2009 Document Revised: 03/20/2016 Document Reviewed: 11/17/2014 Elsevier Interactive Patient Education  2017 [employer].

## 2021-08-21 NOTE — Progress Notes (Signed)
Subjective:   Jose Gamble is a 80 y.o. male who presents for Medicare Annual/Subsequent preventive examination.  Review of Systems     Cardiac Risk Factors include: advanced age (>2men, >58 women);dyslipidemia;hypertension;male gender     Objective:    Today's Vitals   08/21/21 1507  BP: 130/80  Pulse: (!) 56  Temp: 98.2 F (36.8 C)  TempSrc: Temporal  SpO2: 99%  Weight: 180 lb 12.8 oz (82 kg)  Height: 5\' 10"  (1.778 m)   Body mass index is 25.94 kg/m.  Advanced Directives 08/21/2021 07/15/2019 12/22/2016 12/22/2016 12/11/2016 08/28/2016  Does Patient Have a Medical Advance Directive? Yes Yes Yes Yes Yes Yes  Type of Advance Directive Living will;Healthcare Power of [place];Living will [place];Living will [place];Living will [place];Living will [place];Living will  Does patient want to make changes to medical advance directive? No - Patient declined - No - Patient declined - - No - Patient declined  Copy of [place] in Chart? No - copy requested No - copy requested No - copy requested No - copy requested - Yes    Current Medications (verified) Outpatient Encounter Medications as of 08/21/2021  Medication Sig   atorvastatin (LIPITOR) 10 MG tablet TAKE 1 TABLET BY MOUTH DAILY   naproxen sodium (ALEVE) 220 MG tablet Take 220 mg by mouth.   nystatin cream (MYCOSTATIN) Apply 1 application topically 2 (two) times daily.   No facility-administered encounter medications on file as of 08/21/2021.    Allergies (verified) Diovan [valsartan]   History: Past Medical History:  Diagnosis Date   Arthritis    History of kidney stones    Hyperlipidemia    Hypertension    Past Surgical History:  Procedure Laterality Date   [place] cyst removed from tongue      CATARACT EXTRACTION, BILATERAL Bilateral 10/2016   left knee arthroscopy  Left    TOTAL  KNEE ARTHROPLASTY Right 12/22/2016   Procedure: RIGHT TOTAL KNEE ARTHROPLASTY;  Surgeon: Paralee Cancel, MD;  Location: [facility];  Service: Orthopedics;  Laterality: Right;  Requests 90 mins; Adductor Block   Family History  Problem Relation Age of Onset   Cancer Father 38       prostate   Cancer Brother 9       prostate   Alcohol abuse Brother    Early death Brother    Heart disease Neg Hx    Hyperlipidemia Neg Hx    Hypertension Neg Hx    Stroke Neg Hx    Diabetes Neg Hx    Depression Neg Hx    COPD Neg Hx    Asthma Neg Hx    Social History   Socioeconomic History   Marital status: Widowed    Spouse name: Not on file   Number of children: 2   Years of education: Not on file   Highest education level: Not on file  Occupational History   Occupation: retired  Tobacco Use   Smoking status: Former   Smokeless tobacco: Former  Scientific laboratory technician Use: Never used  Substance and Sexual Activity   Alcohol use: No   Drug use: No   Sexual activity: Yes  Other Topics Concern   Not on file  Social History Narrative   Not on file   Social Determinants of Health   Financial Resource Strain: Low Risk    Difficulty of Paying Living Expenses: Not  hard at all  Food Insecurity: No Food Insecurity   Worried About Charity fundraiser in the Last Year: Never true   Ran Out of Food in the Last Year: Never true  Transportation Needs: No Transportation Needs   Lack of Transportation (Medical): No   Lack of Transportation (Non-Medical): No  Physical Activity: Sufficiently Active   Days of Exercise per Week: 7 days   Minutes of Exercise per Session: 60 min  Stress: No Stress Concern Present   Feeling of Stress : Not at all  Social Connections: Moderately Integrated   Frequency of Communication with Friends and Family: More than three times a week   Frequency of Social Gatherings with Friends and Family: More than three times a week   Attends Religious Services: More than 4 times per  year   Active Member of [employer] or Organizations: Yes   Attends Archivist Meetings: More than 4 times per year   Marital Status: Widowed    Tobacco Counseling Counseling given: Not Answered   Clinical Intake:  Pre-visit preparation completed: Yes  Pain : No/denies pain     BMI - recorded: 25.94 Nutritional Status: BMI 25 -29 Overweight Nutritional Risks: None Diabetes: No  How often do you need to have someone help you when you read instructions, pamphlets, or other written materials from your doctor or pharmacy?: 1 - Never What is the last grade level you completed in school?: [place]; 2 years at [employer]  Diabetic? no  Interpreter Needed?: No  Information entered by :: Lisette Abu, LPN   Activities of Daily Living In your present state of health, do you have any difficulty performing the following activities: 08/21/2021 11/21/2020  Hearing? N N  Vision? N N  Difficulty concentrating or making decisions? N N  Walking or climbing stairs? N N  Dressing or bathing? N N  Doing errands, shopping? N N  Preparing Food and eating ? N -  Using the Toilet? N -  In the past six months, have you accidently leaked urine? N -  Do you have problems with loss of bowel control? N -  Managing your Medications? N -  Managing your Finances? N -  Housekeeping or managing your Housekeeping? N -  Some recent data might be hidden    Patient Care Team: [name], MD as PCP - General  Indicate any recent Medical Services you may have received from other than [facility] providers in the past year (date may be approximate).     Assessment:   This is a routine wellness examination for Shahzad.  Hearing/Vision screen Hearing Screening - Comments:: Patient denied any hearing difficulty.   No hearing aids.  Vision Screening - Comments:: Patient wears corrective glasses/contacts.  Eye exam done annually by: Rutherford Guys, MD.  Dietary issues and exercise activities  discussed: Current Exercise Habits: The patient has a physically strenuous job, but has no regular exercise apart from work., Exercise limited by: None identified   Goals Addressed               This Visit's Progress     Patient Stated (pt-stated)        My goal is to maintain my health, stay physically & socially active and travel.      Depression Screen PHQ 2/9 Scores 08/21/2021 11/21/2020 07/15/2019 05/21/2018 05/20/2018 08/28/2016  PHQ - 2 Score 0 0 0 0 0 0    Fall Risk Fall Risk  08/21/2021 11/21/2020 07/15/2019 05/21/2018  05/20/2018  Falls in the past year? 0 0 0 No No  Number falls in past yr: 0 - 0 - -  Injury with Fall? 0 - 1 - -  Risk for fall due to : No Fall Risks - - - -  Follow up Falls evaluation completed - - - -    FALL RISK PREVENTION PERTAINING TO THE HOME:  Any stairs in or around the home? Yes  If so, are there any without handrails? No  Home free of loose throw rugs in walkways, pet beds, electrical cords, etc? Yes  Adequate lighting in your home to reduce risk of falls? Yes   ASSISTIVE DEVICES UTILIZED TO PREVENT FALLS:  Life alert? No  Use of a cane, walker or w/c? No  Grab bars in the bathroom? No  Shower chair or bench in shower? No  Elevated toilet seat or a handicapped toilet? No   TIMED UP AND GO:  Was the test performed? Yes .  Length of time to ambulate 10 feet: 6 sec.   Gait steady and fast without use of assistive device  Cognitive Function: Normal cognitive status assessed by direct observation by this Nurse Health Advisor. No abnormalities found.          Immunizations Immunization History  Administered Date(s) Administered   Influenza Split 08/02/2017   Influenza, High Dose Seasonal PF 08/27/2016, 07/28/2018, 06/28/2019   Influenza-Unspecified 08/27/2020   PFIZER(Purple Top)SARS-COV-2 Vaccination 12/10/2019, 01/02/2020   Pneumococcal Conjugate-13 08/27/2016, 07/28/2018   Pneumococcal Polysaccharide-23 05/20/2018   Td  08/28/2007   Tdap 05/20/2018   Zoster Recombinat (Shingrix) 07/28/2018, 10/07/2018    TDAP status: Up to date  Flu Vaccine status: Up to date  Pneumococcal vaccine status: Up to date  Covid-19 vaccine status: Completed vaccines  Qualifies for Shingles Vaccine? Yes   Zostavax completed No   Shingrix Completed?: Yes  Screening Tests Health Maintenance  Topic Date Due   Hepatitis C Screening  Never done   COVID-19 Vaccine (3 - Booster for Pfizer series) 02/27/2020   INFLUENZA VACCINE  05/27/2021   TETANUS/TDAP  05/20/2028   Pneumonia Vaccine 9+ Years old  Completed   Zoster Vaccines- Shingrix  Completed   HPV VACCINES  Aged Out    Health Maintenance  Health Maintenance Due  Topic Date Due   Hepatitis C Screening  Never done   COVID-19 Vaccine (3 - Booster for Pfizer series) 02/27/2020   INFLUENZA VACCINE  05/27/2021    Colorectal cancer screening: No longer required.   Lung Cancer Screening: (Low Dose CT Chest recommended if Age 97-80 years, 30 pack-year currently smoking OR have quit w/in 15years.) does not qualify.   Lung Cancer Screening Referral: no  Additional Screening:  Hepatitis C Screening: does qualify; Completed no  Vision Screening: Recommended annual ophthalmology exams for early detection of glaucoma and other disorders of the eye. Is the patient up to date with their annual eye exam?  Yes  Who is the provider or what is the name of the office in which the patient attends annual eye exams? Rutherford Guys, MD. If pt is not established with a provider, would they like to be referred to a provider to establish care? No .   Dental Screening: Recommended annual dental exams for proper oral hygiene  Community Resource Referral / Chronic Care Management: CRR required this visit?  No   CCM required this visit?  No      Plan:     I have personally reviewed and  noted the following in the patient's chart:   Medical and social history Use of alcohol,  tobacco or illicit drugs  Current medications and supplements including opioid prescriptions. Patient is not currently taking opioid prescriptions. Functional ability and status Nutritional status Physical activity Advanced directives List of other physicians Hospitalizations, surgeries, and ER visits in previous 12 months Vitals Screenings to include cognitive, depression, and falls Referrals and appointments  In addition, I have reviewed and discussed with patient certain preventive protocols, quality metrics, and best practice recommendations. A written personalized care plan for preventive services as well as general preventive health recommendations were provided to patient.     Sheral Flow, LPN   [id number]   Nurse Notes:  Hearing Screening - Comments:: Patient denied any hearing difficulty.   No hearing aids.  Vision Screening - Comments:: Patient wears corrective glasses/contacts.  Eye exam done annually by: Rutherford Guys, MD.

## 2022-02-17 ENCOUNTER — Other Ambulatory Visit: Payer: Self-pay | Admitting: Internal Medicine

## 2022-02-17 DIAGNOSIS — E785 Hyperlipidemia, unspecified: Secondary | ICD-10-CM

## 2022-02-20 ENCOUNTER — Other Ambulatory Visit: Payer: Self-pay | Admitting: Internal Medicine

## 2022-02-20 DIAGNOSIS — E785 Hyperlipidemia, unspecified: Secondary | ICD-10-CM

## 2022-02-21 ENCOUNTER — Telehealth: Payer: Self-pay | Admitting: Internal Medicine

## 2022-02-21 NOTE — Telephone Encounter (Signed)
1.Medication Requested: ?atorvastatin (LIPITOR) 10 MG tablet ?2. Pharmacy (Name, Street, [place]): ?[facility] [place] - [address] Phone:  [phone]  ?Fax:  [phone]  ?  ? ?3. On Med List: yes ? ?4. Last Visit with PCP: ? ?5. Next visit date with PCP: ? ? ?Agent: Please be advised that RX refills may take up to 3 business days. We ask that you follow-up with your pharmacy.  ?

## 2022-02-24 NOTE — Telephone Encounter (Signed)
Pt scheduled for 5/2 @ 11.40am ?

## 2022-02-25 ENCOUNTER — Encounter: Payer: Self-pay | Admitting: Internal Medicine

## 2022-02-25 ENCOUNTER — Ambulatory Visit (INDEPENDENT_AMBULATORY_CARE_PROVIDER_SITE_OTHER): Payer: Medicare Other | Admitting: Internal Medicine

## 2022-02-25 VITALS — BP 156/82 | HR 60 | Temp 98.1°F | Ht 70.0 in | Wt 187.0 lb

## 2022-02-25 DIAGNOSIS — Z1159 Encounter for screening for other viral diseases: Secondary | ICD-10-CM

## 2022-02-25 DIAGNOSIS — Z0001 Encounter for general adult medical examination with abnormal findings: Secondary | ICD-10-CM

## 2022-02-25 DIAGNOSIS — I10 Essential (primary) hypertension: Secondary | ICD-10-CM

## 2022-02-25 DIAGNOSIS — D508 Other iron deficiency anemias: Secondary | ICD-10-CM | POA: Diagnosis not present

## 2022-02-25 DIAGNOSIS — E785 Hyperlipidemia, unspecified: Secondary | ICD-10-CM

## 2022-02-25 LAB — LIPID PANEL
Cholesterol: 140 mg/dL (ref 0–200)
HDL: 42.1 mg/dL (ref >39.00)
LDL Cholesterol: 75 mg/dL (ref 0–99)
NonHDL: 97.86
Total CHOL/HDL Ratio: 3
Triglycerides: 113 mg/dL (ref 0.0–149.0)
VLDL: 22.6 mg/dL (ref 0.0–40.0)

## 2022-02-25 LAB — HEPATIC FUNCTION PANEL
ALT: 12 U/L (ref 0–53)
AST: 16 U/L (ref 0–37)
Albumin: 4.3 g/dL (ref 3.5–5.2)
Alkaline Phosphatase: 75 U/L (ref 39–117)
Bilirubin, Direct: 0.1 mg/dL (ref 0.0–0.3)
Total Bilirubin: 0.6 mg/dL (ref 0.2–1.2)
Total Protein: 6.8 g/dL (ref 6.0–8.3)

## 2022-02-25 LAB — CBC WITH DIFFERENTIAL/PLATELET
Basophils Absolute: 0 10*3/uL (ref 0.0–0.1)
Basophils Relative: 0.4 % (ref 0.0–3.0)
Eosinophils Absolute: 0.1 10*3/uL (ref 0.0–0.7)
Eosinophils Relative: 2.2 % (ref 0.0–5.0)
HCT: 39.2 % (ref 39.0–52.0)
Hemoglobin: 12.9 g/dL — ABNORMAL LOW (ref 13.0–17.0)
Lymphocytes Relative: 22.4 % (ref 12.0–46.0)
Lymphs Abs: 1.5 10*3/uL (ref 0.7–4.0)
MCHC: 32.8 g/dL (ref 30.0–36.0)
MCV: 87.7 fl (ref 78.0–100.0)
Monocytes Absolute: 0.8 10*3/uL (ref 0.1–1.0)
Monocytes Relative: 12.7 % — ABNORMAL HIGH (ref 3.0–12.0)
Neutro Abs: 4.1 10*3/uL (ref 1.4–7.7)
Neutrophils Relative %: 62.3 % (ref 43.0–77.0)
Platelets: 167 10*3/uL (ref 150.0–400.0)
RBC: 4.47 Mil/uL (ref 4.22–5.81)
RDW: 15.5 % (ref 11.5–15.5)
WBC: 6.5 10*3/uL (ref 4.0–10.5)

## 2022-02-25 LAB — BASIC METABOLIC PANEL
BUN: 12 mg/dL (ref 6–23)
CO2: 28 mEq/L (ref 19–32)
Calcium: 9 mg/dL (ref 8.4–10.5)
Chloride: 104 mEq/L (ref 96–112)
Creatinine, Ser: 1.08 mg/dL (ref 0.40–1.50)
GFR: 65.24 mL/min (ref >60.00)
Glucose, Bld: 89 mg/dL (ref 70–99)
Potassium: 4.3 mEq/L (ref 3.5–5.1)
Sodium: 139 mEq/L (ref 135–145)

## 2022-02-25 LAB — IBC + FERRITIN
Ferritin: 9.6 ng/mL — ABNORMAL LOW (ref 22.0–322.0)
Iron: 60 ug/dL (ref 42–165)
Saturation Ratios: 13.9 % — ABNORMAL LOW (ref 20.0–50.0)
TIBC: 432.6 ug/dL (ref 250.0–450.0)
Transferrin: 309 mg/dL (ref 212.0–360.0)

## 2022-02-25 LAB — TSH: TSH: 2.41 u[IU]/mL (ref 0.35–5.50)

## 2022-02-25 MED ORDER — ATORVASTATIN CALCIUM 10 MG PO TABS: 10.0000 mg | ORAL_TABLET | Freq: Every day | ORAL | 1 refills | Status: DC

## 2022-02-25 NOTE — Patient Instructions (Signed)
Health Maintenance, Male Adopting a healthy lifestyle and getting preventive care are important in promoting health and wellness. Ask your health care provider about: The right schedule for you to have regular tests and exams. Things you can do on your own to prevent diseases and keep yourself healthy. What should I know about diet, weight, and exercise? Eat a healthy diet  Eat a diet that includes plenty of vegetables, fruits, low-fat dairy products, and lean protein. Do not eat a lot of foods that are high in solid fats, added sugars, or sodium. Maintain a healthy weight Body mass index (BMI) is a measurement that can be used to identify possible weight problems. It estimates body fat based on height and weight. Your health care provider can help determine your BMI and help you achieve or maintain a healthy weight. Get regular exercise Get regular exercise. This is one of the most important things you can do for your health. Most adults should: Exercise for at least 150 minutes each week. The exercise should increase your heart rate and make you sweat (moderate-intensity exercise). Do strengthening exercises at least twice a week. This is in addition to the moderate-intensity exercise. Spend less time sitting. Even light physical activity can be beneficial. Watch cholesterol and blood lipids Have your blood tested for lipids and cholesterol at 80 years of age, then have this test every 5 years. You may need to have your cholesterol levels checked more often if: Your lipid or cholesterol levels are high. You are older than 80 years of age. You are at high risk for heart disease. What should I know about cancer screening? Many types of cancers can be detected early and may often be prevented. Depending on your health history and family history, you may need to have cancer screening at various ages. This may include screening for: Colorectal cancer. Prostate cancer. Skin cancer. Lung  cancer. What should I know about heart disease, diabetes, and high blood pressure? Blood pressure and heart disease High blood pressure causes heart disease and increases the risk of stroke. This is more likely to develop in people who have high blood pressure readings or are overweight. Talk with your health care provider about your target blood pressure readings. Have your blood pressure checked: Every 3-5 years if you are 18-39 years of age. Every year if you are 40 years old or older. If you are between the ages of 65 and 75 and are a current or former smoker, ask your health care provider if you should have a one-time screening for abdominal aortic aneurysm (AAA). Diabetes Have regular diabetes screenings. This checks your fasting blood sugar level. Have the screening done: Once every three years after age 45 if you are at a normal weight and have a low risk for diabetes. More often and at a younger age if you are overweight or have a high risk for diabetes. What should I know about preventing infection? Hepatitis B If you have a higher risk for hepatitis B, you should be screened for this virus. Talk with your health care provider to find out if you are at risk for hepatitis B infection. Hepatitis C Blood testing is recommended for: Everyone born from 1945 through 1965. Anyone with known risk factors for hepatitis C. Sexually transmitted infections (STIs) You should be screened each year for STIs, including gonorrhea and chlamydia, if: You are sexually active and are younger than 80 years of age. You are older than 80 years of age and your   health care provider tells you that you are at risk for this type of infection. Your sexual activity has changed since you were last screened, and you are at increased risk for chlamydia or gonorrhea. Ask your health care provider if you are at risk. Ask your health care provider about whether you are at high risk for HIV. Your health care provider  may recommend a prescription medicine to help prevent HIV infection. If you choose to take medicine to prevent HIV, you should first get tested for HIV. You should then be tested every 3 months for as long as you are taking the medicine. Follow these instructions at home: Alcohol use Do not drink alcohol if your health care provider tells you not to drink. If you drink alcohol: Limit how much you have to 0-2 drinks a day. Know how much alcohol is in your drink. In the [place] one drink equals one 12 oz bottle of beer (355 mL), one 5 oz glass of wine (148 mL), or one 1 oz glass of hard liquor (44 mL). Lifestyle Do not use any products that contain nicotine or tobacco. These products include cigarettes, chewing tobacco, and vaping devices, such as e-cigarettes. If you need help quitting, ask your health care provider. Do not use street drugs. Do not share needles. Ask your health care provider for help if you need support or information about quitting drugs. General instructions Schedule regular health, dental, and eye exams. Stay current with your vaccines. Tell your health care provider if: You often feel depressed. You have ever been abused or do not feel safe at home. Summary Adopting a healthy lifestyle and getting preventive care are important in promoting health and wellness. Follow your health care provider's instructions about healthy diet, exercising, and getting tested or screened for diseases. Follow your health care provider's instructions on monitoring your cholesterol and blood pressure. This information is not intended to replace advice given to you by your health care provider. Make sure you discuss any questions you have with your health care provider. Document Revised: 03/04/2021 Document Reviewed: 03/04/2021 Elsevier Patient Education  2023 Elsevier Inc.  

## 2022-02-25 NOTE — Progress Notes (Signed)
? ?Subjective:  ?Patient ID: Jose Gamble, male    DOB: December 16, 1940  Age: 81 y.o. MRN: [id number] ? ?CC: Annual Exam, Anemia, Hypertension, and Hyperlipidemia ? ? ?HPI ?[name] presents for a CPX and f/up -  ? ?He recently donated blood and was told this his iron level is low. He complains of chronic fatigue.  He is active and denies chest pain, shortness of breath, diaphoresis, dizziness, lightheadedness, or edema. ? ?Outpatient Medications Prior to Visit  ?Medication Sig Dispense Refill  ? atorvastatin (LIPITOR) 10 MG tablet TAKE 1 TABLET BY MOUTH DAILY 90 tablet 1  ? naproxen sodium (ALEVE) 220 MG tablet Take 220 mg by mouth.    ? nystatin cream (MYCOSTATIN) Apply 1 application topically 2 (two) times daily. 30 g 2  ? ?No facility-administered medications prior to visit.  ? ? ?ROS ?Review of Systems  ?Constitutional:  Positive for fatigue. Negative for chills, diaphoresis and unexpected weight change.  ?HENT: Negative.    ?Eyes: Negative.   ?Respiratory:  Negative for chest tightness, shortness of breath and wheezing.   ?Cardiovascular:  Negative for chest pain, palpitations and leg swelling.  ?Gastrointestinal:  Negative for abdominal pain, blood in stool, constipation, diarrhea, nausea and vomiting.  ?Endocrine: Negative.   ?Genitourinary: Negative.   ?Musculoskeletal:  Positive for arthralgias. Negative for myalgias.  ?Skin: Negative.  Negative for pallor.  ?Neurological:  Negative for dizziness, weakness, light-headedness and headaches.  ?Hematological:  Negative for adenopathy. Does not bruise/bleed easily.  ?Psychiatric/Behavioral: Negative.    ? ?Objective:  ?BP (!) 156/82 (BP Location: Right Arm, Patient Position: Sitting, Cuff Size: Large)   Pulse 60   Temp 98.1 ?F (36.7 ?C) (Oral)   Ht '5\' 10"'$  (1.778 m)   Wt 187 lb (84.8 kg)   SpO2 95%   BMI 26.83 kg/m?  ? ?BP Readings from Last 3 Encounters:  ?02/25/22 (!) 156/82  ?08/21/21 130/80  ?01/30/21 126/72  ? ? ?Wt Readings from Last 3 Encounters:   ?02/25/22 187 lb (84.8 kg)  ?08/21/21 180 lb 12.8 oz (82 kg)  ?01/30/21 188 lb (85.3 kg)  ? ? ?Physical Exam ?Vitals reviewed.  ?HENT:  ?   Nose: Nose normal.  ?   Mouth/Throat:  ?   Mouth: Mucous membranes are moist.  ?Eyes:  ?   General: No scleral icterus. ?   Conjunctiva/sclera: Conjunctivae normal.  ?Cardiovascular:  ?   Rate and Rhythm: Normal rate and regular rhythm.  ?   Heart sounds: No murmur heard. ?Pulmonary:  ?   Effort: Pulmonary effort is normal.  ?   Breath sounds: No stridor. No wheezing, rhonchi or rales.  ?Abdominal:  ?   General: Abdomen is flat.  ?   Palpations: There is no mass.  ?   Tenderness: There is no abdominal tenderness. There is no guarding.  ?   Hernia: No hernia is present.  ?Musculoskeletal:     ?   General: Normal range of motion.  ?   Cervical back: Neck supple.  ?   Right lower leg: No edema.  ?   Left lower leg: No edema.  ?Lymphadenopathy:  ?   Cervical: No cervical adenopathy.  ?Skin: ?   General: Skin is warm and dry.  ?Neurological:  ?   General: No focal deficit present.  ?   Mental Status: He is alert.  ?Psychiatric:     ?   Mood and Affect: Mood normal.     ?   Behavior: Behavior normal.  ? ? ?  Lab Results  ?Component Value Date  ? WBC 6.5 02/25/2022  ? HGB 12.9 (L) 02/25/2022  ? HCT 39.2 02/25/2022  ? PLT 167.0 02/25/2022  ? GLUCOSE 89 02/25/2022  ? CHOL 140 02/25/2022  ? TRIG 113.0 02/25/2022  ? HDL 42.10 02/25/2022  ? [place] 75 02/25/2022  ? ALT 12 02/25/2022  ? AST 16 02/25/2022  ? NA 139 02/25/2022  ? K 4.3 02/25/2022  ? CL 104 02/25/2022  ? CREATININE 1.08 02/25/2022  ? BUN 12 02/25/2022  ? CO2 28 02/25/2022  ? TSH 2.41 02/25/2022  ? PSA 0.78 01/30/2021  ? HGBA1C 5.8 03/07/2010  ? ? ?No results found. ? ?Assessment & Plan:  ? ?Moishy was seen today for annual exam, anemia, hypertension and hyperlipidemia. ? ?Diagnoses and all orders for this visit: ? ?Essential hypertension- His blood pressure is well controlled. ?-     Basic metabolic panel; Future ?-     CBC with  Differential/Platelet; Future ?-     Hepatic function panel; Future ?-     TSH; Future ?-     TSH ?-     Hepatic function panel ?-     CBC with Differential/Platelet ?-     Basic metabolic panel ? ?Hyperlipidemia with target LDL less than 100- LDL goal achieved. Doing well on the statin  ?-     Lipid panel; Future ?-     Hepatic function panel; Future ?-     atorvastatin (LIPITOR) 10 MG tablet; Take 1 tablet (10 mg total) by mouth daily. ?-     TSH; Future ?-     TSH ?-     Hepatic function panel ?-     Lipid panel ? ?Need for hepatitis C screening test ?-     Hepatitis C antibody; Future ?-     Hepatitis C antibody ? ?Iron deficiency anemia secondary to inadequate dietary iron intake- Will treat with an oral iron supplement. ?-     IBC + Ferritin; Future ?-     IBC + Ferritin ?-     ACCRUFER 30 MG CAPS; Take 1 capsule by mouth daily. ? ?Encounter for general adult medical examination with abnormal findings- Exam completed, labs reviewed, vaccines are up-to-date, no cancer screenings indicated, patient education was given. ? ? ?I have discontinued [name]. Witham's naproxen sodium and nystatin cream. I have also changed his atorvastatin. Additionally, I am having him start on ACCRUFeR. ? ?Meds ordered this encounter  ?Medications  ? atorvastatin (LIPITOR) 10 MG tablet  ?  Sig: Take 1 tablet (10 mg total) by mouth daily.  ?  Dispense:  90 tablet  ?  Refill:  1  ? ACCRUFER 30 MG CAPS  ?  Sig: Take 1 capsule by mouth daily.  ?  Dispense:  90 capsule  ?  Refill:  1  ? ? ? ?Follow-up: Return in about 6 months (around 08/28/2022). ? ?Scarlette Calico, MD ?

## 2022-02-26 LAB — HEPATITIS C ANTIBODY
Hepatitis C Ab: NONREACTIVE
SIGNAL TO CUT-OFF: 0.09 (ref <1.00)

## 2022-02-26 MED ORDER — ACCRUFER 30 MG PO CAPS: 1.0000 | ORAL_CAPSULE | Freq: Every day | ORAL | 1 refills | Status: DC

## 2022-03-04 ENCOUNTER — Telehealth: Payer: Self-pay

## 2022-03-04 NOTE — Telephone Encounter (Signed)
Pt was advised that he needs a PA for ACCRUFER 30 MG CAPS. ? ?Please advise ?

## 2022-03-10 DIAGNOSIS — L439 Lichen planus, unspecified: Secondary | ICD-10-CM | POA: Diagnosis not present

## 2022-03-25 ENCOUNTER — Telehealth: Payer: Self-pay | Admitting: Internal Medicine

## 2022-03-25 DIAGNOSIS — D508 Other iron deficiency anemias: Secondary | ICD-10-CM

## 2022-03-25 MED ORDER — ACCRUFER 30 MG PO CAPS: 1.0000 | ORAL_CAPSULE | Freq: Every day | ORAL | 1 refills | Status: DC

## 2022-03-25 NOTE — Telephone Encounter (Signed)
Pt called in and states he was supposed to receive medication for his iron.   States he hasn't heard anything from the office- and he needs it.   Pt thinks the name of the medication is Lipitor.   Please send to  [facility], [place], [address] Phone:  [phone]  Fax:  [phone]       Call mobile with an update.

## 2022-03-25 NOTE — Telephone Encounter (Signed)
Called pt, he stated that he has not received med yet.   I have sent the Rx again to [facility]. Pt aware.

## 2022-06-10 DIAGNOSIS — Z961 Presence of intraocular lens: Secondary | ICD-10-CM | POA: Diagnosis not present

## 2022-06-10 DIAGNOSIS — H524 Presbyopia: Secondary | ICD-10-CM | POA: Diagnosis not present

## 2022-08-22 NOTE — Progress Notes (Addendum)
This encounter was created in error - please disregard. Patient declined AWV at this time. No charge captured.  Adahlia Stembridge N. Michaelpaul Apo, LPN. [place] [phone]

## 2022-08-22 NOTE — Addendum Note (Signed)
Addended by: Sheral Flow on: 08/22/2022 08:53 AM   Modules accepted: Level of Service

## 2022-10-06 ENCOUNTER — Other Ambulatory Visit: Payer: Self-pay | Admitting: Internal Medicine

## 2022-10-06 DIAGNOSIS — E785 Hyperlipidemia, unspecified: Secondary | ICD-10-CM

## 2022-10-15 ENCOUNTER — Telehealth: Payer: Self-pay | Admitting: Internal Medicine

## 2022-10-15 NOTE — Telephone Encounter (Signed)
Caller & Relationship to patient: PT  Call back number: [phone]  Date of last office visit: 02/25/2022  Date of next office visit: N/A  Medication(s) to be refilled:  atorvastatin (LIPITOR) 10 MG tablet   Preferred Pharmacy:   [facility], [place]    Informed PT that they were due for a follow up and that they should have had one near November. Offered to set up for follow up or at least schedule for their annual and they declined. Stated they wanted to hear from [facility] first.

## 2022-10-15 NOTE — Telephone Encounter (Signed)
Reached out to PT and was able to get them scheduled in next available. They expressed understanding!

## 2022-10-28 ENCOUNTER — Encounter: Payer: Self-pay | Admitting: Internal Medicine

## 2022-10-28 ENCOUNTER — Ambulatory Visit (INDEPENDENT_AMBULATORY_CARE_PROVIDER_SITE_OTHER): Payer: BLUE CROSS/BLUE SHIELD | Admitting: Internal Medicine

## 2022-10-28 VITALS — BP 158/96 | HR 62 | Temp 98.2°F | Resp 16 | Ht 70.0 in | Wt 188.0 lb

## 2022-10-28 DIAGNOSIS — E785 Hyperlipidemia, unspecified: Secondary | ICD-10-CM | POA: Diagnosis not present

## 2022-10-28 DIAGNOSIS — L309 Dermatitis, unspecified: Secondary | ICD-10-CM | POA: Diagnosis not present

## 2022-10-28 DIAGNOSIS — I10 Essential (primary) hypertension: Secondary | ICD-10-CM | POA: Diagnosis not present

## 2022-10-28 DIAGNOSIS — D508 Other iron deficiency anemias: Secondary | ICD-10-CM | POA: Diagnosis not present

## 2022-10-28 MED ORDER — ATORVASTATIN CALCIUM 10 MG PO TABS: 10.0000 mg | ORAL_TABLET | Freq: Every day | ORAL | 1 refills | Status: DC

## 2022-10-28 MED ORDER — INDAPAMIDE 1.25 MG PO TABS: 1.2500 mg | ORAL_TABLET | Freq: Every day | ORAL | 1 refills | Status: DC

## 2022-10-28 NOTE — Progress Notes (Signed)
Subjective:  Patient ID: Jose Gamble, male    DOB: 08/04/1941  Age: 82 y.o. MRN: [id number]  CC: Hypertension, Anemia, and Hyperlipidemia   HPI Jose Gamble presents for f/up -   He is active and denies chest pain, shortness of breath, diaphoresis, dizziness, lightheadedness, or edema.  He complains of erythema and itching at the tip of his penis.  He denies dysuria.  Outpatient Medications Prior to Visit  Medication Sig Dispense Refill   ACCRUFER 30 MG CAPS Take 1 capsule by mouth daily. 90 capsule 1   atorvastatin (LIPITOR) 10 MG tablet Take 1 tablet (10 mg total) by mouth daily. 90 tablet 1   No facility-administered medications prior to visit.    ROS Review of Systems  Constitutional: Negative.  Negative for chills, diaphoresis, fatigue and fever.  HENT: Negative.    Eyes: Negative.   Respiratory:  Negative for cough, chest tightness, shortness of breath and wheezing.   Cardiovascular:  Negative for chest pain, palpitations and leg swelling.  Gastrointestinal:  Negative for abdominal pain, diarrhea, nausea and vomiting.  Endocrine: Negative.   Genitourinary: Negative.  Negative for difficulty urinating, dysuria, penile discharge, penile swelling and scrotal swelling.  Musculoskeletal: Negative.  Negative for arthralgias.  Skin: Negative.  Negative for color change.  Neurological: Negative.  Negative for dizziness, weakness and headaches.  Hematological:  Negative for adenopathy. Does not bruise/bleed easily.  Psychiatric/Behavioral: Negative.      Objective:  BP (!) 158/96 (BP Location: Right Arm, Patient Position: Sitting, Cuff Size: Large)   Pulse 62   Temp 98.2 F (36.8 C) (Oral)   Resp 16   Ht '5\' 10"'$  (1.778 m)   Wt 188 lb (85.3 kg)   SpO2 94%   BMI 26.98 kg/m   BP Readings from Last 3 Encounters:  10/28/22 (!) 158/96  02/25/22 (!) 156/82  08/21/21 130/80    Wt Readings from Last 3 Encounters:  10/28/22 188 lb (85.3 kg)  02/25/22 187 lb (84.8 kg)   08/21/21 180 lb 12.8 oz (82 kg)    Physical Exam Vitals reviewed.  HENT:     Nose: Nose normal.     Mouth/Throat:     Mouth: Mucous membranes are moist.  Eyes:     General: No scleral icterus.    Conjunctiva/sclera: Conjunctivae normal.  Cardiovascular:     Rate and Rhythm: Normal rate and regular rhythm.     Heart sounds: No murmur heard. Pulmonary:     Effort: Pulmonary effort is normal.     Breath sounds: No stridor. No wheezing, rhonchi or rales.  Abdominal:     General: Abdomen is flat.     Palpations: There is no mass.     Tenderness: There is no abdominal tenderness. There is no guarding or rebound.     Hernia: No hernia is present.  Genitourinary:    Penis: Circumcised. Erythema present. No phimosis, paraphimosis, hypospadias, tenderness, discharge, swelling or lesions.      Testes: Normal.     Comments: There is faint erythema at the meatus Musculoskeletal:        General: Normal range of motion.     Cervical back: Neck supple.     Right lower leg: No edema.     Left lower leg: No edema.  Lymphadenopathy:     Cervical: No cervical adenopathy.  Skin:    General: Skin is warm and dry.     Coloration: Skin is not pale.     Findings: No  rash.  Neurological:     General: No focal deficit present.     Mental Status: He is alert. Mental status is at baseline.  Psychiatric:        Mood and Affect: Mood normal.        Behavior: Behavior normal.     Lab Results  Component Value Date   WBC 6.7 10/29/2022   HGB 15.8 10/29/2022   HCT 46.0 10/29/2022   PLT 196.0 10/29/2022   GLUCOSE 91 10/28/2022   CHOL 140 02/25/2022   TRIG 113.0 02/25/2022   HDL 42.10 02/25/2022   LDLCALC 75 02/25/2022   ALT 12 02/25/2022   AST 16 02/25/2022   NA 141 10/28/2022   K 4.4 10/28/2022   CL 104 10/28/2022   CREATININE 0.85 10/28/2022   BUN 14 10/28/2022   CO2 29 10/28/2022   TSH 2.41 02/25/2022   PSA 0.78 01/30/2021   HGBA1C 5.8 03/07/2010    No results  found.  Assessment & Plan:   Suzanne was seen today for hypertension, anemia and hyperlipidemia.  Diagnoses and all orders for this visit:  Essential hypertension- His blood pressure is not adequately well-controlled.  Will treat with indapamide. -     indapamide (LOZOL) 1.25 MG tablet; Take 1 tablet (1.25 mg total) by mouth daily. -     Basic metabolic panel; Future -     Urinalysis, Routine w reflex microscopic; Future -     Urinalysis, Routine w reflex microscopic -     Basic metabolic panel  Iron deficiency anemia secondary to inadequate dietary iron intake- His H&H and iron are normal now.  He can stop taking the iron supplement. -     CBC with Differential/Platelet; Future -     IBC + Ferritin; Future -     IBC + Ferritin -     CBC with Differential/Platelet  Hyperlipidemia with target LDL less than 100- LDL goal achieved. Doing well on the statin  -     atorvastatin (LIPITOR) 10 MG tablet; Take 1 tablet (10 mg total) by mouth daily.  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   I am having Jose Gamble start on indapamide and triamcinolone cream. I am also having him maintain his ACCRUFeR and atorvastatin.  Meds ordered this encounter  Medications   indapamide (LOZOL) 1.25 MG tablet    Sig: Take 1 tablet (1.25 mg total) by mouth daily.    Dispense:  90 tablet    Refill:  1   atorvastatin (LIPITOR) 10 MG tablet    Sig: Take 1 tablet (10 mg total) by mouth daily.    Dispense:  90 tablet    Refill:  1   triamcinolone cream (KENALOG) 0.5 %    Sig: Apply 1 Application topically 3 (three) times daily.    Dispense:  30 g    Refill:  1     Follow-up: Return in about 6 months (around 04/28/2023).  Scarlette Calico, MD

## 2022-10-28 NOTE — Patient Instructions (Signed)
Hypertension, Adult High blood pressure (hypertension) is when the force of blood pumping through the arteries is too strong. The arteries are the blood vessels that carry blood from the heart throughout the body. Hypertension forces the heart to work harder to pump blood and may cause arteries to become narrow or stiff. Untreated or uncontrolled hypertension can lead to a heart attack, heart failure, a stroke, kidney disease, and other problems. A blood pressure reading consists of a higher number over a lower number. Ideally, your blood pressure should be below 120/80. The first ("top") number is called the systolic pressure. It is a measure of the pressure in your arteries as your heart beats. The second ("bottom") number is called the diastolic pressure. It is a measure of the pressure in your arteries as the heart relaxes. What are the causes? The exact cause of this condition is not known. There are some conditions that result in high blood pressure. What increases the risk? Certain factors may make you more likely to develop high blood pressure. Some of these risk factors are under your control, including: Smoking. Not getting enough exercise or physical activity. Being overweight. Having too much fat, sugar, calories, or salt (sodium) in your diet. Drinking too much alcohol. Other risk factors include: Having a personal history of heart disease, diabetes, high cholesterol, or kidney disease. Stress. Having a family history of high blood pressure and high cholesterol. Having obstructive sleep apnea. Age. The risk increases with age. What are the signs or symptoms? High blood pressure may not cause symptoms. Very high blood pressure (hypertensive crisis) may cause: Headache. Fast or irregular heartbeats (palpitations). Shortness of breath. Nosebleed. Nausea and vomiting. Vision changes. Severe chest pain, dizziness, and seizures. How is this diagnosed? This condition is diagnosed by  measuring your blood pressure while you are seated, with your arm resting on a flat surface, your legs uncrossed, and your feet flat on the floor. The cuff of the blood pressure monitor will be placed directly against the skin of your upper arm at the level of your heart. Blood pressure should be measured at least twice using the same arm. Certain conditions can cause a difference in blood pressure between your right and left arms. If you have a high blood pressure reading during one visit or you have normal blood pressure with other risk factors, you may be asked to: Return on a different day to have your blood pressure checked again. Monitor your blood pressure at home for 1 week or longer. If you are diagnosed with hypertension, you may have other blood or imaging tests to help your health care provider understand your overall risk for other conditions. How is this treated? This condition is treated by making healthy lifestyle changes, such as eating healthy foods, exercising more, and reducing your alcohol intake. You may be referred for counseling on a healthy diet and physical activity. Your health care provider may prescribe medicine if lifestyle changes are not enough to get your blood pressure under control and if: Your systolic blood pressure is above 130. Your diastolic blood pressure is above 80. Your personal target blood pressure may vary depending on your medical conditions, your age, and other factors. Follow these instructions at home: Eating and drinking  Eat a diet that is high in fiber and potassium, and low in sodium, added sugar, and fat. An example of this eating plan is called the DASH diet. DASH stands for Dietary Approaches to Stop Hypertension. To eat this way: Eat   plenty of fresh fruits and vegetables. Try to fill one half of your plate at each meal with fruits and vegetables. Eat whole grains, such as whole-wheat pasta, brown rice, or whole-grain bread. Fill about one  fourth of your plate with whole grains. Eat or drink low-fat dairy products, such as skim milk or low-fat yogurt. Avoid fatty cuts of meat, processed or cured meats, and poultry with skin. Fill about one fourth of your plate with lean proteins, such as fish, chicken without skin, beans, eggs, or tofu. Avoid pre-made and processed foods. These tend to be higher in sodium, added sugar, and fat. Reduce your daily sodium intake. Many people with hypertension should eat less than 1,500 mg of sodium a day. Do not drink alcohol if: Your health care provider tells you not to drink. You are pregnant, may be pregnant, or are planning to become pregnant. If you drink alcohol: Limit how much you have to: 0-1 drink a day for women. 0-2 drinks a day for men. Know how much alcohol is in your drink. In the [place] one drink equals one 12 oz bottle of beer (355 mL), one 5 oz glass of wine (148 mL), or one 1 oz glass of hard liquor (44 mL). Lifestyle  Work with your health care provider to maintain a healthy body weight or to lose weight. Ask what an ideal weight is for you. Get at least 30 minutes of exercise that causes your heart to beat faster (aerobic exercise) most days of the week. Activities may include walking, swimming, or biking. Include exercise to strengthen your muscles (resistance exercise), such as Pilates or lifting weights, as part of your weekly exercise routine. Try to do these types of exercises for 30 minutes at least 3 days a week. Do not use any products that contain nicotine or tobacco. These products include cigarettes, chewing tobacco, and vaping devices, such as e-cigarettes. If you need help quitting, ask your health care provider. Monitor your blood pressure at home as told by your health care provider. Keep all follow-up visits. This is important. Medicines Take over-the-counter and prescription medicines only as told by your health care provider. Follow directions carefully. Blood  pressure medicines must be taken as prescribed. Do not skip doses of blood pressure medicine. Doing this puts you at risk for problems and can make the medicine less effective. Ask your health care provider about side effects or reactions to medicines that you should watch for. Contact a health care provider if you: Think you are having a reaction to a medicine you are taking. Have headaches that keep coming back (recurring). Feel dizzy. Have swelling in your ankles. Have trouble with your vision. Get help right away if you: Develop a severe headache or confusion. Have unusual weakness or numbness. Feel faint. Have severe pain in your chest or abdomen. Vomit repeatedly. Have trouble breathing. These symptoms may be an emergency. Get help right away. Call 911. Do not wait to see if the symptoms will go away. Do not drive yourself to the hospital. Summary Hypertension is when the force of blood pumping through your arteries is too strong. If this condition is not controlled, it may put you at risk for serious complications. Your personal target blood pressure may vary depending on your medical conditions, your age, and other factors. For most people, a normal blood pressure is less than 120/80. Hypertension is treated with lifestyle changes, medicines, or a combination of both. Lifestyle changes include losing weight, eating a healthy,   low-sodium diet, exercising more, and limiting alcohol. This information is not intended to replace advice given to you by your health care provider. Make sure you discuss any questions you have with your health care provider. Document Revised: 08/20/2021 Document Reviewed: 08/20/2021 Elsevier Patient Education  2023 Elsevier Inc.  

## 2022-10-29 ENCOUNTER — Other Ambulatory Visit (INDEPENDENT_AMBULATORY_CARE_PROVIDER_SITE_OTHER): Payer: BLUE CROSS/BLUE SHIELD

## 2022-10-29 DIAGNOSIS — D508 Other iron deficiency anemias: Secondary | ICD-10-CM

## 2022-10-29 DIAGNOSIS — L309 Dermatitis, unspecified: Secondary | ICD-10-CM | POA: Insufficient documentation

## 2022-10-29 LAB — BASIC METABOLIC PANEL
BUN: 14 mg/dL (ref 6–23)
CO2: 29 mEq/L (ref 19–32)
Calcium: 9.5 mg/dL (ref 8.4–10.5)
Chloride: 104 mEq/L (ref 96–112)
Creatinine, Ser: 0.85 mg/dL (ref 0.40–1.50)
GFR: 82.22 mL/min (ref >60.00)
Glucose, Bld: 91 mg/dL (ref 70–99)
Potassium: 4.4 mEq/L (ref 3.5–5.1)
Sodium: 141 mEq/L (ref 135–145)

## 2022-10-29 LAB — CBC WITH DIFFERENTIAL/PLATELET
Basophils Absolute: 0 10*3/uL (ref 0.0–0.1)
Basophils Relative: 0.4 % (ref 0.0–3.0)
Eosinophils Absolute: 0.2 10*3/uL (ref 0.0–0.7)
Eosinophils Relative: 2.8 % (ref 0.0–5.0)
HCT: 46 % (ref 39.0–52.0)
Hemoglobin: 15.8 g/dL (ref 13.0–17.0)
Lymphocytes Relative: 21.3 % (ref 12.0–46.0)
Lymphs Abs: 1.4 10*3/uL (ref 0.7–4.0)
MCHC: 34.3 g/dL (ref 30.0–36.0)
MCV: 94 fl (ref 78.0–100.0)
Monocytes Absolute: 0.8 10*3/uL (ref 0.1–1.0)
Monocytes Relative: 12.2 % — ABNORMAL HIGH (ref 3.0–12.0)
Neutro Abs: 4.2 10*3/uL (ref 1.4–7.7)
Neutrophils Relative %: 63.3 % (ref 43.0–77.0)
Platelets: 196 10*3/uL (ref 150.0–400.0)
RBC: 4.9 Mil/uL (ref 4.22–5.81)
RDW: 13.6 % (ref 11.5–15.5)
WBC: 6.7 10*3/uL (ref 4.0–10.5)

## 2022-10-29 LAB — URINALYSIS, ROUTINE W REFLEX MICROSCOPIC
Bilirubin Urine: NEGATIVE
Hgb urine dipstick: NEGATIVE
Ketones, ur: NEGATIVE
Leukocytes,Ua: NEGATIVE
Nitrite: NEGATIVE
RBC / HPF: NONE SEEN (ref 0–?)
Specific Gravity, Urine: 1.02 (ref 1.000–1.030)
Total Protein, Urine: NEGATIVE
Urine Glucose: NEGATIVE
Urobilinogen, UA: 0.2 (ref 0.0–1.0)
pH: 6.5 (ref 5.0–8.0)

## 2022-10-29 LAB — IBC + FERRITIN
Ferritin: 36.5 ng/mL (ref 22.0–322.0)
Iron: 120 ug/dL (ref 42–165)
Saturation Ratios: 36 % (ref 20.0–50.0)
TIBC: 333.2 ug/dL (ref 250.0–450.0)
Transferrin: 238 mg/dL (ref 212.0–360.0)

## 2022-10-29 MED ORDER — TRIAMCINOLONE ACETONIDE 0.5 % EX CREA: 1.0000 | TOPICAL_CREAM | Freq: Three times a day (TID) | CUTANEOUS | 1 refills | Status: DC

## 2022-12-22 DIAGNOSIS — M25512 Pain in left shoulder: Secondary | ICD-10-CM | POA: Diagnosis not present

## 2022-12-22 DIAGNOSIS — M25522 Pain in left elbow: Secondary | ICD-10-CM | POA: Diagnosis not present

## 2022-12-31 DIAGNOSIS — M25522 Pain in left elbow: Secondary | ICD-10-CM | POA: Diagnosis not present

## 2023-01-07 ENCOUNTER — Ambulatory Visit: Payer: Self-pay | Admitting: Licensed Clinical Social Worker

## 2023-01-07 DIAGNOSIS — S46292D Other injury of muscle, fascia and tendon of other parts of biceps, left arm, subsequent encounter: Secondary | ICD-10-CM | POA: Diagnosis not present

## 2023-01-07 DIAGNOSIS — M67814 Other specified disorders of tendon, left shoulder: Secondary | ICD-10-CM | POA: Diagnosis not present

## 2023-01-07 NOTE — Patient Outreach (Signed)
  Care Coordination  Initial Visit Note   01/07/2023 Name: CHANSE KAGEL MRN: [id number] DOB: 1941/05/11  ROY TOKARZ is a 82 y.o. year old male who sees [name], MD for primary care. I spoke with  [name] by phone today.  What matters to the patients health and wellness today?    Patient scheduled phone appointment to obtain additional information about the Care Coordination Program..   SDOH assessments and interventions completed:  No   Care Coordination Interventions:  No, not indicated   Follow up plan: Follow up call scheduled for 01/09/23    Encounter Outcome:  Pt. Visit Completed   Casimer Lanius, [place] [phone]

## 2023-01-07 NOTE — Patient Instructions (Signed)
  It was a pleasure speaking with you today. Per your request a Care Coordination phone appointment is scheduled 01/09/23  Casimer Lanius, [place] [phone]

## 2023-01-09 ENCOUNTER — Ambulatory Visit: Payer: Self-pay | Admitting: Licensed Clinical Social Worker

## 2023-01-09 NOTE — Patient Instructions (Addendum)
Visit Information  Thank you for taking time to visit with me today. Please don't hesitate to contact me if I can be of assistance to you.    Care Coordination provides support specific to your health needs that extend beyond exceptional routine office care you already receive from your primary care doctor.    If you are eligible for standard Care Coordination, there is no cost to you.  The Care Coordination team is made up of the following team members: Registered Nurse Care Guide: disease management, health education, care coordination and complex case management Clinical Social Work: Complex Care Coordination including coordination of level of care needs, mental and behavioral health assessment and recommendations, and connection to long-term mental health support Clinical Pharmacist: medication management, assistance and disease management Community Resource Care Guides: Forensic psychologist Team: dedicated team of scheduling professionals to support patient and clinical team scheduling needs  Please call [phone] if you would like to schedule a phone appointment with one of the team members.    Patient verbalizes understanding of instructions and care plan provided today and agrees to view in [place]. Active MyChart status and patient understanding of how to access instructions and care plan via MyChart confirmed with patient.     No further follow up required: [place], [place] [phone]

## 2023-01-09 NOTE — Patient Outreach (Signed)
  Care Coordination  Initial Visit Note   01/09/2023 Name: Jose Gamble MRN: [id number] DOB: 03/30/1941  Jose Gamble is a 82 y.o. year old male who sees Jose Lima, MD for primary care. I spoke with  Jose Gamble by phone today.  What matters to the patients health and wellness today?  Patient reports no concerns or needs from Care Coordination team with health and wellness related to physical or mental heath. .   SDOH assessments and interventions completed:  Yes  SDOH Interventions Today    Flowsheet Row Most Recent Value  SDOH Interventions   Food Insecurity Interventions Intervention Not Indicated  Housing Interventions Intervention Not Indicated  Transportation Interventions Intervention Not Indicated  Utilities Interventions Intervention Not Indicated        Care Coordination Interventions:  Yes, provided  Interventions Today    Flowsheet Row Most Recent Value  Chronic Disease   Chronic disease during today's visit Hypertension (HTN)  General Interventions   General Interventions Discussed/Reviewed General Interventions Discussed  [Reviewed Care Coordination program]       Follow up plan: No further intervention required.   Encounter Outcome:  Pt. Visit Completed

## 2023-01-20 DIAGNOSIS — M25522 Pain in left elbow: Secondary | ICD-10-CM | POA: Diagnosis not present

## 2023-01-20 DIAGNOSIS — M79622 Pain in left upper arm: Secondary | ICD-10-CM | POA: Diagnosis not present

## 2023-01-27 DIAGNOSIS — M79622 Pain in left upper arm: Secondary | ICD-10-CM | POA: Diagnosis not present

## 2023-01-27 DIAGNOSIS — M25522 Pain in left elbow: Secondary | ICD-10-CM | POA: Diagnosis not present

## 2023-04-28 ENCOUNTER — Other Ambulatory Visit: Payer: Self-pay | Admitting: Internal Medicine

## 2023-04-28 DIAGNOSIS — I10 Essential (primary) hypertension: Secondary | ICD-10-CM

## 2023-05-01 ENCOUNTER — Other Ambulatory Visit: Payer: Self-pay | Admitting: Internal Medicine

## 2023-05-01 DIAGNOSIS — E785 Hyperlipidemia, unspecified: Secondary | ICD-10-CM

## 2023-05-11 ENCOUNTER — Telehealth: Payer: Self-pay | Admitting: Internal Medicine

## 2023-05-11 NOTE — Telephone Encounter (Signed)
Prescription Request  05/11/2023  LOV: 10/28/2022  What is the name of the medication or equipment?  indapamide (LOZOL) 1.25 MG tablet    Have you contacted your pharmacy to request a refill? No   Which pharmacy would you like this sent to?    [facility] - [place], [place] - [address] [place] [address] Phone: [phone] Fax: [phone]  Patient notified that their request is being sent to the clinical staff for review and that they should receive a response within 2 business days.   Please advise at Mobile [phone] (mobile)

## 2023-05-11 NOTE — Telephone Encounter (Signed)
He is due for a 6 month f/up

## 2023-05-11 NOTE — Telephone Encounter (Signed)
Pt is scheduled for 7/16 @ 9.20am for 3mo f/u

## 2023-05-12 ENCOUNTER — Telehealth: Payer: Self-pay | Admitting: Radiology

## 2023-05-12 ENCOUNTER — Ambulatory Visit (INDEPENDENT_AMBULATORY_CARE_PROVIDER_SITE_OTHER): Payer: Medicare Other | Admitting: Internal Medicine

## 2023-05-12 ENCOUNTER — Encounter: Payer: Self-pay | Admitting: Internal Medicine

## 2023-05-12 VITALS — BP 132/66 | HR 61 | Temp 98.0°F | Ht 70.0 in | Wt 185.0 lb

## 2023-05-12 DIAGNOSIS — I10 Essential (primary) hypertension: Secondary | ICD-10-CM | POA: Diagnosis not present

## 2023-05-12 DIAGNOSIS — D696 Thrombocytopenia, unspecified: Secondary | ICD-10-CM

## 2023-05-12 DIAGNOSIS — E785 Hyperlipidemia, unspecified: Secondary | ICD-10-CM

## 2023-05-12 DIAGNOSIS — D508 Other iron deficiency anemias: Secondary | ICD-10-CM | POA: Diagnosis not present

## 2023-05-12 LAB — BASIC METABOLIC PANEL
BUN: 17 mg/dL (ref 6–23)
CO2: 30 mEq/L (ref 19–32)
Calcium: 10 mg/dL (ref 8.4–10.5)
Chloride: 104 mEq/L (ref 96–112)
Creatinine, Ser: 0.92 mg/dL (ref 0.40–1.50)
GFR: 78.41 mL/min (ref >60.00)
Glucose, Bld: 126 mg/dL — ABNORMAL HIGH (ref 70–99)
Potassium: 3.9 mEq/L (ref 3.5–5.1)
Sodium: 140 mEq/L (ref 135–145)

## 2023-05-12 LAB — HEPATIC FUNCTION PANEL
ALT: 15 U/L (ref 0–53)
AST: 17 U/L (ref 0–37)
Albumin: 4.4 g/dL (ref 3.5–5.2)
Alkaline Phosphatase: 63 U/L (ref 39–117)
Bilirubin, Direct: 0.1 mg/dL (ref 0.0–0.3)
Total Bilirubin: 0.6 mg/dL (ref 0.2–1.2)
Total Protein: 7 g/dL (ref 6.0–8.3)

## 2023-05-12 LAB — IBC + FERRITIN
Ferritin: 27.3 ng/mL (ref 22.0–322.0)
Iron: 124 ug/dL (ref 42–165)
Saturation Ratios: 34.2 % (ref 20.0–50.0)
TIBC: 362.6 ug/dL (ref 250.0–450.0)
Transferrin: 259 mg/dL (ref 212.0–360.0)

## 2023-05-12 LAB — CBC WITH DIFFERENTIAL/PLATELET
Basophils Absolute: 0 10*3/uL (ref 0.0–0.1)
Basophils Relative: 0.4 % (ref 0.0–3.0)
Eosinophils Absolute: 0.2 10*3/uL (ref 0.0–0.7)
Eosinophils Relative: 2.9 % (ref 0.0–5.0)
HCT: 45.8 % (ref 39.0–52.0)
Hemoglobin: 15.3 g/dL (ref 13.0–17.0)
Lymphocytes Relative: 20.5 % (ref 12.0–46.0)
Lymphs Abs: 1.4 10*3/uL (ref 0.7–4.0)
MCHC: 33.4 g/dL (ref 30.0–36.0)
MCV: 93.4 fl (ref 78.0–100.0)
Monocytes Absolute: 0.7 10*3/uL (ref 0.1–1.0)
Monocytes Relative: 10.6 % (ref 3.0–12.0)
Neutro Abs: 4.6 10*3/uL (ref 1.4–7.7)
Neutrophils Relative %: 65.6 % (ref 43.0–77.0)
Platelets: 141 10*3/uL — ABNORMAL LOW (ref 150.0–400.0)
RBC: 4.91 Mil/uL (ref 4.22–5.81)
RDW: 13.4 % (ref 11.5–15.5)
WBC: 6.9 10*3/uL (ref 4.0–10.5)

## 2023-05-12 LAB — TSH: TSH: 2.31 u[IU]/mL (ref 0.35–5.50)

## 2023-05-12 LAB — LIPID PANEL
Cholesterol: 163 mg/dL (ref 0–200)
HDL: 39.6 mg/dL (ref >39.00)
NonHDL: 123.7
Total CHOL/HDL Ratio: 4
Triglycerides: 226 mg/dL — ABNORMAL HIGH (ref 0.0–149.0)
VLDL: 45.2 mg/dL — ABNORMAL HIGH (ref 0.0–40.0)

## 2023-05-12 LAB — LDL CHOLESTEROL, DIRECT: Direct LDL: 90 mg/dL

## 2023-05-12 MED ORDER — INDAPAMIDE 1.25 MG PO TABS
1.2500 mg | ORAL_TABLET | Freq: Every day | ORAL | 1 refills | Status: DC
Start: 2023-05-12 — End: 2023-11-03

## 2023-05-12 MED ORDER — ATORVASTATIN CALCIUM 10 MG PO TABS
10.0000 mg | ORAL_TABLET | Freq: Every day | ORAL | 1 refills | Status: DC
Start: 2023-05-12 — End: 2023-10-29

## 2023-05-12 NOTE — Telephone Encounter (Signed)
Contacted Walker Kehr to schedule their annual wellness visit. Patient declined to schedule AWV at this time.  Graylon Good CMA

## 2023-05-12 NOTE — Progress Notes (Unsigned)
Subjective:  Patient ID: Jose Gamble, male    DOB: [birth year]  Age: 82 y.o. MRN: [id number]  CC: Hypertension and Hyperlipidemia   HPI Jose Gamble presents for f/up ----  Discussed the use of AI scribe software for clinical note transcription with the patient, who gave verbal consent to proceed.  History of Present Illness   The patient with a history of iron deficiency, presents with fatigue, particularly in the late afternoon. They attribute this to heat intolerance, which has been more noticeable recently. They deny any chest pain, shortness of breath, or symptoms of anemia such as weakness. Their iron levels were previously marginal but have since normalized with over-the-counter supplementation. They deny any signs of blood loss, such as hematochezia, hematuria, or epistaxis.  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 They note that their endurance is generally good, but the hot weather impacts their stamina more than it used to. They utilize a four-wheeler for transportation across their large property, limiting their need for extensive walking.       Outpatient Medications Prior to Visit  Medication Sig Dispense Refill   ACCRUFER 30 MG CAPS Take 1 capsule by mouth daily. 90 capsule 1   triamcinolone cream (KENALOG) 0.5 % Apply 1 Application topically 3 (three) times daily. 30 g 1   atorvastatin (LIPITOR) 10 MG tablet TAKE 1 TABLET BY MOUTH DAILY 90 tablet 1   indapamide (LOZOL) 1.25 MG tablet Take 1 tablet (1.25 mg total) by mouth daily. 90 tablet 1   No facility-administered medications prior to visit.    ROS Review of Systems  Objective:  BP 132/66 (BP Location: Left Arm, Patient Position: Sitting, Cuff Size: Large)   Pulse 61   Temp 98 F (36.7 C) (Oral)   Ht 5\' 10"  (1.778 m)   Wt 185 lb (83.9 kg)   SpO2 94%   BMI 26.54 kg/m   BP Readings from Last 3 Encounters:  05/12/23 132/66  10/28/22 (!) 158/96  02/25/22  (!) 156/82    Wt Readings from Last 3 Encounters:  05/12/23 185 lb (83.9 kg)  10/28/22 188 lb (85.3 kg)  02/25/22 187 lb (84.8 kg)    Physical Exam  Lab Results  Component Value Date   WBC 6.9 05/12/2023   HGB 15.3 05/12/2023   HCT 45.8 05/12/2023   PLT 141.0 (L) 05/12/2023   GLUCOSE 126 (H) 05/12/2023   CHOL 163 05/12/2023   TRIG 226.0 (H) 05/12/2023   HDL 39.60 05/12/2023   LDLDIRECT 90.0 05/12/2023   LDLCALC 75 02/25/2022   ALT 15 05/12/2023   AST 17 05/12/2023   NA 140 05/12/2023   K 3.9 05/12/2023   CL 104 05/12/2023   CREATININE 0.92 05/12/2023   BUN 17 05/12/2023   CO2 30 05/12/2023   TSH 2.31 05/12/2023   PSA 0.78 01/30/2021   HGBA1C 5.8 03/07/2010    No results found.  Assessment & Plan:  Essential hypertension -     Basic metabolic panel; Future -     CBC with Differential/Platelet; Future -     TSH; Future -     Indapamide; Take 1 tablet (1.25 mg total) by mouth daily.  Dispense: 90 tablet; Refill: 1  Hyperlipidemia with target LDL less than 100 -     Lipid panel; Future -     Hepatic function panel; Future -     TSH; Future -     Atorvastatin Calcium; Take 1 tablet (10 mg  total) by mouth daily.  Dispense: 90 tablet; Refill: 1  Iron deficiency anemia secondary to inadequate dietary iron intake -     IBC + Ferritin; Future  Thrombocytopenia (HCC)  Other orders -     LDL cholesterol, direct     Follow-up: Return in about 6 months (around 11/12/2023).  Sanda Linger, MD

## 2023-05-12 NOTE — Patient Instructions (Signed)
Hypertension, Adult High blood pressure (hypertension) is when the force of blood pumping through the arteries is too strong. The arteries are the blood vessels that carry blood from the heart throughout the body. Hypertension forces the heart to work harder to pump blood and may cause arteries to become narrow or stiff. Untreated or uncontrolled hypertension can lead to a heart attack, heart failure, a stroke, kidney disease, and other problems. A blood pressure reading consists of a higher number over a lower number. Ideally, your blood pressure should be below 120/80. The first ("top") number is called the systolic pressure. It is a measure of the pressure in your arteries as your heart beats. The second ("bottom") number is called the diastolic pressure. It is a measure of the pressure in your arteries as the heart relaxes. What are the causes? The exact cause of this condition is not known. There are some conditions that result in high blood pressure. What increases the risk? Certain factors may make you more likely to develop high blood pressure. Some of these risk factors are under your control, including: Smoking. Not getting enough exercise or physical activity. Being overweight. Having too much fat, sugar, calories, or salt (sodium) in your diet. Drinking too much alcohol. Other risk factors include: Having a personal history of heart disease, diabetes, high cholesterol, or kidney disease. Stress. Having a family history of high blood pressure and high cholesterol. Having obstructive sleep apnea. Age. The risk increases with age. What are the signs or symptoms? High blood pressure may not cause symptoms. Very high blood pressure (hypertensive crisis) may cause: Headache. Fast or irregular heartbeats (palpitations). Shortness of breath. Nosebleed. Nausea and vomiting. Vision changes. Severe chest pain, dizziness, and seizures. How is this diagnosed? This condition is diagnosed by  measuring your blood pressure while you are seated, with your arm resting on a flat surface, your legs uncrossed, and your feet flat on the floor. The cuff of the blood pressure monitor will be placed directly against the skin of your upper arm at the level of your heart. Blood pressure should be measured at least twice using the same arm. Certain conditions can cause a difference in blood pressure between your right and left arms. If you have a high blood pressure reading during one visit or you have normal blood pressure with other risk factors, you may be asked to: Return on a different day to have your blood pressure checked again. Monitor your blood pressure at home for 1 week or longer. If you are diagnosed with hypertension, you may have other blood or imaging tests to help your health care provider understand your overall risk for other conditions. How is this treated? This condition is treated by making healthy lifestyle changes, such as eating healthy foods, exercising more, and reducing your alcohol intake. You may be referred for counseling on a healthy diet and physical activity. Your health care provider may prescribe medicine if lifestyle changes are not enough to get your blood pressure under control and if: Your systolic blood pressure is above 130. Your diastolic blood pressure is above 80. Your personal target blood pressure may vary depending on your medical conditions, your age, and other factors. Follow these instructions at home: Eating and drinking  Eat a diet that is high in fiber and potassium, and low in sodium, added sugar, and fat. An example of this eating plan is called the DASH diet. DASH stands for Dietary Approaches to Stop Hypertension. To eat this way: Eat   plenty of fresh fruits and vegetables. Try to fill one half of your plate at each meal with fruits and vegetables. Eat whole grains, such as whole-wheat pasta, brown rice, or whole-grain bread. Fill about one  fourth of your plate with whole grains. Eat or drink low-fat dairy products, such as skim milk or low-fat yogurt. Avoid fatty cuts of meat, processed or cured meats, and poultry with skin. Fill about one fourth of your plate with lean proteins, such as fish, chicken without skin, beans, eggs, or tofu. Avoid pre-made and processed foods. These tend to be higher in sodium, added sugar, and fat. Reduce your daily sodium intake. Many people with hypertension should eat less than 1,500 mg of sodium a day. Do not drink alcohol if: Your health care provider tells you not to drink. You are pregnant, may be pregnant, or are planning to become pregnant. If you drink alcohol: Limit how much you have to: 0-1 drink a day for women. 0-2 drinks a day for men. Know how much alcohol is in your drink. In the [place] one drink equals one 12 oz bottle of beer (355 mL), one 5 oz glass of wine (148 mL), or one 1 oz glass of hard liquor (44 mL). Lifestyle  Work with your health care provider to maintain a healthy body weight or to lose weight. Ask what an ideal weight is for you. Get at least 30 minutes of exercise that causes your heart to beat faster (aerobic exercise) most days of the week. Activities may include walking, swimming, or biking. Include exercise to strengthen your muscles (resistance exercise), such as Pilates or lifting weights, as part of your weekly exercise routine. Try to do these types of exercises for 30 minutes at least 3 days a week. Do not use any products that contain nicotine or tobacco. These products include cigarettes, chewing tobacco, and vaping devices, such as e-cigarettes. If you need help quitting, ask your health care provider. Monitor your blood pressure at home as told by your health care provider. Keep all follow-up visits. This is important. Medicines Take over-the-counter and prescription medicines only as told by your health care provider. Follow directions carefully. Blood  pressure medicines must be taken as prescribed. Do not skip doses of blood pressure medicine. Doing this puts you at risk for problems and can make the medicine less effective. Ask your health care provider about side effects or reactions to medicines that you should watch for. Contact a health care provider if you: Think you are having a reaction to a medicine you are taking. Have headaches that keep coming back (recurring). Feel dizzy. Have swelling in your ankles. Have trouble with your vision. Get help right away if you: Develop a severe headache or confusion. Have unusual weakness or numbness. Feel faint. Have severe pain in your chest or abdomen. Vomit repeatedly. Have trouble breathing. These symptoms may be an emergency. Get help right away. Call 911. Do not wait to see if the symptoms will go away. Do not drive yourself to the hospital. Summary Hypertension is when the force of blood pumping through your arteries is too strong. If this condition is not controlled, it may put you at risk for serious complications. Your personal target blood pressure may vary depending on your medical conditions, your age, and other factors. For most people, a normal blood pressure is less than 120/80. Hypertension is treated with lifestyle changes, medicines, or a combination of both. Lifestyle changes include losing weight, eating a healthy,   low-sodium diet, exercising more, and limiting alcohol. This information is not intended to replace advice given to you by your health care provider. Make sure you discuss any questions you have with your health care provider. Document Revised: 08/20/2021 Document Reviewed: 08/20/2021 Elsevier Patient Education  2024 Elsevier Inc.  

## 2023-05-22 ENCOUNTER — Telehealth: Payer: Self-pay | Admitting: Internal Medicine

## 2023-05-22 ENCOUNTER — Other Ambulatory Visit: Payer: Self-pay | Admitting: Internal Medicine

## 2023-05-22 DIAGNOSIS — N481 Balanitis: Secondary | ICD-10-CM

## 2023-05-22 MED ORDER — KETOCONAZOLE 2 % EX CREA: 1.0000 | TOPICAL_CREAM | Freq: Two times a day (BID) | CUTANEOUS | 2 refills | Status: DC

## 2023-05-22 NOTE — Telephone Encounter (Signed)
Patient states that Dr. Yetta Barre had mentioned something he could send in instead of Kenaolg Cream.  That might help a lot more.  Patient was calling to check to see if Dr. Yetta Barre had come up with an alternative to Kenalog.  Please call patient and advise.  Patient's number:  [phone]

## 2023-05-22 NOTE — Telephone Encounter (Signed)
Rx sent 

## 2023-10-26 ENCOUNTER — Other Ambulatory Visit: Payer: Self-pay | Admitting: Internal Medicine

## 2023-10-26 DIAGNOSIS — E785 Hyperlipidemia, unspecified: Secondary | ICD-10-CM

## 2023-11-02 ENCOUNTER — Other Ambulatory Visit: Payer: Self-pay | Admitting: Internal Medicine

## 2023-11-02 DIAGNOSIS — I10 Essential (primary) hypertension: Secondary | ICD-10-CM

## 2023-11-12 ENCOUNTER — Encounter: Payer: Self-pay | Admitting: Internal Medicine

## 2023-11-12 ENCOUNTER — Ambulatory Visit (INDEPENDENT_AMBULATORY_CARE_PROVIDER_SITE_OTHER): Payer: 59 | Admitting: Internal Medicine

## 2023-11-12 VITALS — BP 138/82 | HR 89 | Temp 98.2°F | Resp 16 | Ht 70.0 in | Wt 186.6 lb

## 2023-11-12 DIAGNOSIS — I10 Essential (primary) hypertension: Secondary | ICD-10-CM | POA: Diagnosis not present

## 2023-11-12 DIAGNOSIS — J028 Acute pharyngitis due to other specified organisms: Secondary | ICD-10-CM | POA: Diagnosis not present

## 2023-11-12 DIAGNOSIS — J029 Acute pharyngitis, unspecified: Secondary | ICD-10-CM | POA: Insufficient documentation

## 2023-11-12 DIAGNOSIS — D696 Thrombocytopenia, unspecified: Secondary | ICD-10-CM

## 2023-11-12 LAB — CBC WITH DIFFERENTIAL/PLATELET
Basophils Absolute: 0 10*3/uL (ref 0.0–0.1)
Basophils Relative: 0.5 % (ref 0.0–3.0)
Eosinophils Absolute: 0.3 10*3/uL (ref 0.0–0.7)
Eosinophils Relative: 4.8 % (ref 0.0–5.0)
HCT: 45.3 % (ref 39.0–52.0)
Hemoglobin: 15.3 g/dL (ref 13.0–17.0)
Lymphocytes Relative: 21.9 % (ref 12.0–46.0)
Lymphs Abs: 1.5 10*3/uL (ref 0.7–4.0)
MCHC: 33.7 g/dL (ref 30.0–36.0)
MCV: 93.5 fL (ref 78.0–100.0)
Monocytes Absolute: 1 10*3/uL (ref 0.1–1.0)
Monocytes Relative: 15.2 % — ABNORMAL HIGH (ref 3.0–12.0)
Neutro Abs: 3.9 10*3/uL (ref 1.4–7.7)
Neutrophils Relative %: 57.6 % (ref 43.0–77.0)
Platelets: 119 10*3/uL — ABNORMAL LOW (ref 150.0–400.0)
RBC: 4.84 Mil/uL (ref 4.22–5.81)
RDW: 14 % (ref 11.5–15.5)
WBC: 6.8 10*3/uL (ref 4.0–10.5)

## 2023-11-12 LAB — POC COVID19 BINAXNOW: SARS Coronavirus 2 Ag: NEGATIVE

## 2023-11-12 LAB — BASIC METABOLIC PANEL
BUN: 15 mg/dL (ref 6–23)
CO2: 31 meq/L (ref 19–32)
Calcium: 9.5 mg/dL (ref 8.4–10.5)
Chloride: 104 meq/L (ref 96–112)
Creatinine, Ser: 0.88 mg/dL (ref 0.40–1.50)
GFR: 80.77 mL/min (ref >60.00)
Glucose, Bld: 58 mg/dL — ABNORMAL LOW (ref 70–99)
Potassium: 3.6 meq/L (ref 3.5–5.1)
Sodium: 142 meq/L (ref 135–145)

## 2023-11-12 LAB — FOLATE: Folate: 7.7 ng/mL (ref >5.9)

## 2023-11-12 LAB — POCT RAPID STREP A (OFFICE): Rapid Strep A Screen: NEGATIVE

## 2023-11-12 LAB — POCT INFLUENZA A/B
Influenza A, POC: NEGATIVE
Influenza B, POC: NEGATIVE

## 2023-11-12 LAB — VITAMIN B12: Vitamin B-12: 322 pg/mL (ref 211–911)

## 2023-11-12 NOTE — Patient Instructions (Signed)

## 2023-11-12 NOTE — Progress Notes (Signed)
Subjective:  Patient ID: Jose Gamble, male    DOB: [birth year]  Age: 83 y.o. MRN: [id number]  CC: Sore Throat and Hypertension   HPI Jose Gamble presents for f/up ---  Discussed the use of AI scribe software for clinical note transcription with the patient, who gave verbal consent to proceed.  History of Present Illness   The patient, with a history of hypertension, has been experiencing a sore throat for the past four to five days. The discomfort is most pronounced in the morning and is localized primarily on the right side. The patient denies any associated symptoms such as fever, chills, night sweats, difficulty swallowing, weight loss, or abdominal pain. The sore throat improves after breakfast and is not currently sore.       Outpatient Medications Prior to Visit  Medication Sig Dispense Refill   atorvastatin (LIPITOR) 10 MG tablet TAKE 1 TABLET BY MOUTH DAILY 90 tablet 1   indapamide (LOZOL) 1.25 MG tablet TAKE 1 TABLET BY MOUTH DAILY 90 tablet 0   ketoconazole (NIZORAL) 2 % cream Apply 1 Application topically 2 (two) times daily. 60 g 2   triamcinolone cream (KENALOG) 0.5 % Apply 1 Application topically 3 (three) times daily. 30 g 1   ACCRUFER 30 MG CAPS Take 1 capsule by mouth daily. 90 capsule 1   No facility-administered medications prior to visit.    ROS Review of Systems  Constitutional:  Negative for chills, diaphoresis, fatigue and fever.  HENT:  Positive for sore throat. Negative for ear discharge, ear pain, sinus pressure, sinus pain, trouble swallowing and voice change.   Eyes: Negative.  Negative for visual disturbance.  Respiratory: Negative.  Negative for cough, chest tightness, shortness of breath and wheezing.   Cardiovascular:  Negative for chest pain, palpitations and leg swelling.  Gastrointestinal:  Negative for abdominal pain, diarrhea, nausea and vomiting.  Genitourinary:  Negative for difficulty urinating, dysuria and hematuria.  Musculoskeletal:  Negative.  Negative for arthralgias and myalgias.  Skin: Negative.   Neurological:  Negative for dizziness, weakness and headaches.  Hematological:  Negative for adenopathy. Does not bruise/bleed easily.  Psychiatric/Behavioral: Negative.      Objective:  BP 138/82 (BP Location: Left Arm, Patient Position: Sitting, Cuff Size: Normal)   Pulse 89   Temp 98.2 F (36.8 C) (Oral)   Resp 16   Ht 5\' 10"  (1.778 m)   Wt 186 lb 9.6 oz (84.6 kg)   SpO2 94%   BMI 26.77 kg/m   BP Readings from Last 3 Encounters:  11/12/23 138/82  05/12/23 132/66  10/28/22 (!) 158/96    Wt Readings from Last 3 Encounters:  11/12/23 186 lb 9.6 oz (84.6 kg)  05/12/23 185 lb (83.9 kg)  10/28/22 188 lb (85.3 kg)    Physical Exam Vitals reviewed.  Constitutional:      Appearance: He is well-developed.  HENT:     Mouth/Throat:     Mouth: Mucous membranes are moist.     Pharynx: Posterior oropharyngeal erythema present. No pharyngeal swelling, oropharyngeal exudate, uvula swelling or postnasal drip.     Tonsils: No tonsillar exudate or tonsillar abscesses. 0 on the right. 0 on the left.  Eyes:     General: No scleral icterus.    Conjunctiva/sclera: Conjunctivae normal.  Cardiovascular:     Rate and Rhythm: Bradycardia present.     Heart sounds: Normal heart sounds, S1 normal and S2 normal. No murmur heard.    Comments: EKG- SB,  55 bpm No LVH, Q waves, or ST/T wave changes  Pulmonary:     Effort: Pulmonary effort is normal.     Breath sounds: Normal breath sounds. No decreased breath sounds, wheezing, rhonchi or rales.  Abdominal:     General: Abdomen is flat.     Palpations: There is no mass.     Tenderness: There is no abdominal tenderness. There is no guarding.     Hernia: No hernia is present.  Musculoskeletal:        General: Normal range of motion.     Cervical back: Neck supple.     Right lower leg: No edema.     Left lower leg: No edema.  Lymphadenopathy:     Cervical: No cervical  adenopathy.  Skin:    Coloration: Skin is not pale.     Findings: No bruising, ecchymosis, erythema, petechiae or rash.  Neurological:     General: No focal deficit present.     Mental Status: He is alert. Mental status is at baseline.  Psychiatric:        Mood and Affect: Mood normal.        Behavior: Behavior normal.     Lab Results  Component Value Date   WBC 6.8 11/12/2023   HGB 15.3 11/12/2023   HCT 45.3 11/12/2023   PLT 119.0 (L) 11/12/2023   GLUCOSE 58 (L) 11/12/2023   CHOL 163 05/12/2023   TRIG 226.0 (H) 05/12/2023   HDL 39.60 05/12/2023   LDLDIRECT 90.0 05/12/2023   LDLCALC 75 02/25/2022   ALT 15 05/12/2023   AST 17 05/12/2023   NA 142 11/12/2023   K 3.6 11/12/2023   CL 104 11/12/2023   CREATININE 0.88 11/12/2023   BUN 15 11/12/2023   CO2 31 11/12/2023   TSH 2.31 05/12/2023   PSA 0.78 01/30/2021   HGBA1C 5.8 03/07/2010    No results found.  Assessment & Plan:   Essential hypertension- EKG is negative for LVH. BP is well controlled. -     EKG 12-Lead -     Basic metabolic panel; Future  Pharyngitis due to other organism- This is viral. -     POCT Influenza A/B -     POC COVID-19 BinaxNow -     POCT rapid strep A  Thrombocytopenia (HCC) - PLTs are stable. -     Vitamin B12; Future -     CBC with Differential/Platelet; Future -     Folate; Future     Follow-up: Return in about 6 months (around 05/11/2024).  Sanda Linger, MD

## 2024-01-28 ENCOUNTER — Other Ambulatory Visit: Payer: Self-pay | Admitting: Internal Medicine

## 2024-01-28 DIAGNOSIS — I10 Essential (primary) hypertension: Secondary | ICD-10-CM

## 2024-04-22 ENCOUNTER — Other Ambulatory Visit: Payer: Self-pay | Admitting: Internal Medicine

## 2024-04-22 DIAGNOSIS — E785 Hyperlipidemia, unspecified: Secondary | ICD-10-CM

## 2024-04-25 ENCOUNTER — Other Ambulatory Visit: Payer: Self-pay | Admitting: Internal Medicine

## 2024-04-25 DIAGNOSIS — I10 Essential (primary) hypertension: Secondary | ICD-10-CM

## 2024-05-17 ENCOUNTER — Ambulatory Visit
Admission: EM | Admit: 2024-05-17 | Discharge: 2024-05-17 | Disposition: A | Discharge status: Home / Self Care | Attending: Emergency Medicine | Admitting: Emergency Medicine

## 2024-05-17 ENCOUNTER — Other Ambulatory Visit: Payer: Self-pay

## 2024-05-17 ENCOUNTER — Encounter: Payer: Self-pay | Admitting: Emergency Medicine

## 2024-05-17 DIAGNOSIS — H1031 Unspecified acute conjunctivitis, right eye: Secondary | ICD-10-CM

## 2024-05-17 MED ORDER — MOXIFLOXACIN HCL 0.5 % OP SOLN: 1.0000 [drp] | Freq: Three times a day (TID) | OPHTHALMIC | 0 refills | Status: DC

## 2024-05-17 NOTE — ED Provider Notes (Signed)
 [facility]    CSN: [id number] Arrival date & time: 05/17/24  9061      History   Chief Complaint Chief Complaint  Patient presents with   Conjunctivitis    HPI Jose Gamble is a 83 y.o. male.  Here with 3 day history of right eye redness, irritation, and yellow discharge with matted lashes in the morning Not having eye pain or vision changes No fever or headache  Denies foreign body sensation No eyelid swelling   Past Medical History:  Diagnosis Date   Arthritis    History of kidney stones    Hyperlipidemia    Hypertension     Patient Active Problem List   Diagnosis Date Noted   Pharyngitis 11/12/2023   Thrombocytopenia (HCC) 05/12/2023   Eczema of male genitalia 10/29/2022   Iron deficiency anemia secondary to inadequate dietary iron intake 02/25/2022   Encounter for general adult medical examination with abnormal findings 02/25/2022   Balanitis 01/30/2021   Hyperlipidemia with target LDL less than 100 08/27/2016   Essential hypertension 03/07/2010   OA (osteoarthritis) of knee 10/16/2008    Past Surgical History:  Procedure Laterality Date   italy cyst removed from tongue      CATARACT EXTRACTION, BILATERAL Bilateral 10/2016   left knee arthroscopy  Left    TOTAL KNEE ARTHROPLASTY Right 12/22/2016   Procedure: RIGHT TOTAL KNEE ARTHROPLASTY;  Surgeon: Donnice Car, MD;  Location: [facility];  Service: Orthopedics;  Laterality: Right;  Requests 90 mins; Adductor Block       Home Medications    Prior to Admission medications   Medication Sig Start Date End Date Taking? Authorizing Provider  moxifloxacin  (VIGAMOX ) 0.5 % ophthalmic solution Place 1 drop into both eyes 3 (three) times daily. 05/17/24  Yes Zamorah Ailes, [place], PA-C  atorvastatin  (LIPITOR) 10 MG tablet Take 1 tablet (10 mg total) by mouth daily. TAKE 1 TABLET BY MOUTH DAILY. Patient to follow up with PCP prior to future refills 04/22/24   Joshua Debby CROME, MD  indapamide  (LOZOL ) 1.25 MG  tablet Take 1 tablet (1.25 mg total) by mouth daily. TAKE 1 TABLET (1.25 MG TOTAL) BY MOUTH DAILY. Patient to follow up with PCP prior to future refills 04/25/24   Joshua Debby CROME, MD  ketoconazole  (NIZORAL ) 2 % cream Apply 1 Application topically 2 (two) times daily. 05/22/23   Joshua Debby CROME, MD  triamcinolone  cream (KENALOG ) 0.5 % Apply 1 Application topically 3 (three) times daily. 10/29/22   Joshua Debby CROME, MD    Family History Family History  Problem Relation Age of Onset   Cancer Father 14       prostate   Cancer Brother 25       prostate   Alcohol abuse Brother    Early death Brother    Heart disease Neg Hx    Hyperlipidemia Neg Hx    Hypertension Neg Hx    Stroke Neg Hx    Diabetes Neg Hx    Depression Neg Hx    COPD Neg Hx    Asthma Neg Hx     Social History Social History   Tobacco Use   Smoking status: Former   Smokeless tobacco: Former  Building services engineer status: Never Used  Substance Use Topics   Alcohol use: No   Drug use: No     Allergies   Diovan [valsartan]   Review of Systems Review of Systems As per HPI  Physical Exam Triage Vital Signs ED  Triage Vitals  Encounter Vitals Group     BP 05/17/24 0948 (!) 146/66     Girls Systolic BP Percentile --      Girls Diastolic BP Percentile --      Boys Systolic BP Percentile --      Boys Diastolic BP Percentile --      Pulse Rate 05/17/24 0948 (!) 56     Resp 05/17/24 0948 18     Temp 05/17/24 0948 98.1 F (36.7 C)     Temp Source 05/17/24 0948 Oral     SpO2 05/17/24 0948 95 %     Weight --      Height --      Head Circumference --      Peak Flow --      Pain Score 05/17/24 0949 1     Pain Loc --      Pain Education --      Exclude from Growth Chart --    No data found.  Updated Vital Signs BP (!) 146/66 (BP Location: Left Arm)   Pulse (!) 56   Temp 98.1 F (36.7 C) (Oral)   Resp 18   SpO2 95%   Physical Exam Vitals and nursing note reviewed.  Constitutional:      Appearance:  Normal appearance.  HENT:     Mouth/Throat:     Mouth: Mucous membranes are moist.     Pharynx: Oropharynx is clear.  Eyes:     General: Lids are normal. Lids are everted, no foreign bodies appreciated. Vision grossly intact. Gaze aligned appropriately.        Right eye: No foreign body, discharge or hordeolum.     Extraocular Movements: Extraocular movements intact.     Right eye: Normal extraocular motion.     Conjunctiva/sclera:     Right eye: Right conjunctiva is injected (mild).     Pupils: Pupils are equal, round, and reactive to light.     Comments: No swelling of eyelids. No discharge on exam. No pain with EOM  Cardiovascular:     Rate and Rhythm: Regular rhythm. Bradycardia present.     Pulses: Normal pulses.     Heart sounds: Normal heart sounds.  Pulmonary:     Effort: Pulmonary effort is normal.     Breath sounds: Normal breath sounds.  Musculoskeletal:     Cervical back: Normal range of motion.  Skin:    General: Skin is warm and dry.  Neurological:     Mental Status: He is alert and oriented to person, place, and time.     UC Treatments / Results  Labs (all labs ordered are listed, but only abnormal results are displayed) Labs Reviewed - No data to display  EKG   Radiology No results found.  Procedures Procedures (including critical care time)  Medications Ordered in UC Medications - No data to display  Initial Impression / Assessment and Plan / UC Course  I have reviewed the triage vital signs and the nursing notes.  Pertinent labs & imaging results that were available during my care of the patient were reviewed by me and considered in my medical decision making (see chart for details).  HR is 56. Reports he is active and his rate is usually around 60 Denies chest pain or pressure, shortness of breath, dyspnea, lightheadedness, dizziness.  Conjunctivitis right eye Vigamox  3 times daily for 7 days All questions answered Return if needed  Final  Clinical Impressions(s) / UC Diagnoses   Final  diagnoses:  Acute bacterial conjunctivitis of right eye     Discharge Instructions      Vigamox  -- 1 drop in the right eye, 3 times daily, for 7 days  Continue washing your hands frequently Avoid touching your eyes Make sure to wash and change your pillowcase and sheets      ED Prescriptions     Medication Sig Dispense Auth. Provider   moxifloxacin  (VIGAMOX ) 0.5 % ophthalmic solution Place 1 drop into both eyes 3 (three) times daily. 3 mL Sanye Ledesma, [place], PA-C      PDMP not reviewed this encounter.   Nikkia Devoss, [place], PA-C 05/17/24 1013

## 2024-05-17 NOTE — Discharge Instructions (Signed)
 Vigamox  -- 1 drop in the right eye, 3 times daily, for 7 days  Continue washing your hands frequently Avoid touching your eyes Make sure to wash and change your pillowcase and sheets

## 2024-05-17 NOTE — ED Triage Notes (Signed)
 Pt here for right eye conjunctivitis x 3 days; pt sts drainage in morning and irritation; unsure if got something in his eye Sunday

## 2024-05-23 ENCOUNTER — Other Ambulatory Visit: Payer: Self-pay | Admitting: Internal Medicine

## 2024-05-23 DIAGNOSIS — I10 Essential (primary) hypertension: Secondary | ICD-10-CM

## 2024-06-08 DIAGNOSIS — I5020 Unspecified systolic (congestive) heart failure: Secondary | ICD-10-CM | POA: Diagnosis not present

## 2024-06-20 ENCOUNTER — Other Ambulatory Visit: Payer: Self-pay | Admitting: Internal Medicine

## 2024-06-20 DIAGNOSIS — I10 Essential (primary) hypertension: Secondary | ICD-10-CM

## 2024-06-20 DIAGNOSIS — E785 Hyperlipidemia, unspecified: Secondary | ICD-10-CM

## 2024-06-22 DIAGNOSIS — H04123 Dry eye syndrome of bilateral lacrimal glands: Secondary | ICD-10-CM | POA: Diagnosis not present

## 2024-06-22 DIAGNOSIS — H5203 Hypermetropia, bilateral: Secondary | ICD-10-CM | POA: Diagnosis not present

## 2024-06-22 DIAGNOSIS — H26492 Other secondary cataract, left eye: Secondary | ICD-10-CM | POA: Diagnosis not present

## 2024-06-22 DIAGNOSIS — H524 Presbyopia: Secondary | ICD-10-CM | POA: Diagnosis not present

## 2024-06-22 DIAGNOSIS — H52223 Regular astigmatism, bilateral: Secondary | ICD-10-CM | POA: Diagnosis not present

## 2024-09-07 ENCOUNTER — Other Ambulatory Visit: Payer: Self-pay | Admitting: Internal Medicine

## 2024-09-07 DIAGNOSIS — E785 Hyperlipidemia, unspecified: Secondary | ICD-10-CM

## 2024-09-07 DIAGNOSIS — I10 Essential (primary) hypertension: Secondary | ICD-10-CM

## 2024-09-07 MED ORDER — INDAPAMIDE 1.25 MG PO TABS: 1.2500 mg | ORAL_TABLET | Freq: Every day | ORAL | 0 refills | Status: DC

## 2024-09-07 MED ORDER — ATORVASTATIN CALCIUM 10 MG PO TABS: 10.0000 mg | ORAL_TABLET | Freq: Every day | ORAL | 0 refills | Status: DC

## 2024-09-07 NOTE — Telephone Encounter (Signed)
 Copied from CRM [id number]. Topic: Clinical - Medication Refill >> Sep 07, 2024  9:54 AM Alexandria E wrote: Medication: atorvastatin  (LIPITOR) 10 MG tablet indapamide  (LOZOL ) 1.25 MG tablet   Has the patient contacted their pharmacy? Yes (Agent: If no, request that the patient contact the pharmacy for the refill. If patient does not wish to contact the pharmacy document the reason why and proceed with request.) (Agent: If yes, when and what did the pharmacy advise?)  This is the patient's preferred pharmacy:  [facility] - [place], [place] - [address] [place] [address] Phone: [phone] Fax: [phone]   Is this the correct pharmacy for this prescription? Yes If no, delete pharmacy and type the correct one.   Has the prescription been filled recently? No  Is the patient out of the medication? No, 4 days left. Please do a bridge-fill if able, patient has a medication refill appointment on 12/2.  Has the patient been seen for an appointment in the last year OR does the patient have an upcoming appointment? Yes  Can we respond through MyChart? Yes  Agent: Please be advised that Rx refills may take up to 3 business days. We ask that you follow-up with your pharmacy.

## 2024-09-27 ENCOUNTER — Encounter: Payer: Self-pay | Admitting: Internal Medicine

## 2024-09-27 ENCOUNTER — Ambulatory Visit: Admitting: Internal Medicine

## 2024-09-27 VITALS — BP 140/74 | HR 59 | Temp 97.8°F | Resp 16 | Ht 70.0 in | Wt 181.4 lb

## 2024-09-27 DIAGNOSIS — I10 Essential (primary) hypertension: Secondary | ICD-10-CM

## 2024-09-27 DIAGNOSIS — Z125 Encounter for screening for malignant neoplasm of prostate: Secondary | ICD-10-CM | POA: Insufficient documentation

## 2024-09-27 DIAGNOSIS — I447 Left bundle-branch block, unspecified: Secondary | ICD-10-CM | POA: Insufficient documentation

## 2024-09-27 DIAGNOSIS — Z0000 Encounter for general adult medical examination without abnormal findings: Secondary | ICD-10-CM | POA: Diagnosis not present

## 2024-09-27 DIAGNOSIS — R001 Bradycardia, unspecified: Secondary | ICD-10-CM | POA: Insufficient documentation

## 2024-09-27 DIAGNOSIS — E785 Hyperlipidemia, unspecified: Secondary | ICD-10-CM

## 2024-09-27 DIAGNOSIS — D696 Thrombocytopenia, unspecified: Secondary | ICD-10-CM

## 2024-09-27 DIAGNOSIS — Z0001 Encounter for general adult medical examination with abnormal findings: Secondary | ICD-10-CM

## 2024-09-27 LAB — URINALYSIS, ROUTINE W REFLEX MICROSCOPIC
Bilirubin Urine: NEGATIVE
Hgb urine dipstick: NEGATIVE
Ketones, ur: NEGATIVE
Leukocytes,Ua: NEGATIVE
Nitrite: NEGATIVE
Specific Gravity, Urine: >=1.03 — AB (ref 1.000–1.030)
Total Protein, Urine: NEGATIVE
Urine Glucose: NEGATIVE
Urobilinogen, UA: 0.2 (ref 0.0–1.0)
pH: 5.5 (ref 5.0–8.0)

## 2024-09-27 LAB — BASIC METABOLIC PANEL WITH GFR
BUN: 18 mg/dL (ref 6–23)
CO2: 34 meq/L — ABNORMAL HIGH (ref 19–32)
Calcium: 9.3 mg/dL (ref 8.4–10.5)
Chloride: 103 meq/L (ref 96–112)
Creatinine, Ser: 0.94 mg/dL (ref 0.40–1.50)
GFR: 75.68 mL/min (ref >60.00)
Glucose, Bld: 70 mg/dL (ref 70–99)
Potassium: 4.1 meq/L (ref 3.5–5.1)
Sodium: 143 meq/L (ref 135–145)

## 2024-09-27 LAB — LIPID PANEL
Cholesterol: 156 mg/dL (ref 0–200)
HDL: 38.5 mg/dL — ABNORMAL LOW (ref >39.00)
LDL Cholesterol: 65 mg/dL (ref 0–99)
NonHDL: 117.18
Total CHOL/HDL Ratio: 4
Triglycerides: 261 mg/dL — ABNORMAL HIGH (ref 0.0–149.0)
VLDL: 52.2 mg/dL — ABNORMAL HIGH (ref 0.0–40.0)

## 2024-09-27 LAB — HEPATIC FUNCTION PANEL
ALT: 14 U/L (ref 0–53)
AST: 16 U/L (ref 0–37)
Albumin: 4.4 g/dL (ref 3.5–5.2)
Alkaline Phosphatase: 67 U/L (ref 39–117)
Bilirubin, Direct: 0.1 mg/dL (ref 0.0–0.3)
Total Bilirubin: 0.5 mg/dL (ref 0.2–1.2)
Total Protein: 6.4 g/dL (ref 6.0–8.3)

## 2024-09-27 LAB — CBC WITH DIFFERENTIAL/PLATELET
Basophils Absolute: 0 K/uL (ref 0.0–0.1)
Basophils Relative: 0.4 % (ref 0.0–3.0)
Eosinophils Absolute: 0.2 K/uL (ref 0.0–0.7)
Eosinophils Relative: 2.4 % (ref 0.0–5.0)
HCT: 43.8 % (ref 39.0–52.0)
Hemoglobin: 14.6 g/dL (ref 13.0–17.0)
Lymphocytes Relative: 22.1 % (ref 12.0–46.0)
Lymphs Abs: 1.5 K/uL (ref 0.7–4.0)
MCHC: 33.4 g/dL (ref 30.0–36.0)
MCV: 94.5 fl (ref 78.0–100.0)
Monocytes Absolute: 0.8 K/uL (ref 0.1–1.0)
Monocytes Relative: 11.8 % (ref 3.0–12.0)
Neutro Abs: 4.3 K/uL (ref 1.4–7.7)
Neutrophils Relative %: 63.3 % (ref 43.0–77.0)
Platelets: 180 K/uL (ref 150.0–400.0)
RBC: 4.64 Mil/uL (ref 4.22–5.81)
RDW: 13.9 % (ref 11.5–15.5)
WBC: 6.8 K/uL (ref 4.0–10.5)

## 2024-09-27 LAB — PSA: PSA: 0.83 ng/mL (ref 0.10–4.00)

## 2024-09-27 LAB — TSH: TSH: 1.7 u[IU]/mL (ref 0.35–5.50)

## 2024-09-27 LAB — VITAMIN B12: Vitamin B-12: 566 pg/mL (ref 211–911)

## 2024-09-27 MED ORDER — INDAPAMIDE 1.25 MG PO TABS: 1.2500 mg | ORAL_TABLET | Freq: Every day | ORAL | 0 refills | Status: AC

## 2024-09-27 MED ORDER — ATORVASTATIN CALCIUM 10 MG PO TABS: 10.0000 mg | ORAL_TABLET | Freq: Every day | ORAL | 0 refills | Status: AC

## 2024-09-27 NOTE — Patient Instructions (Signed)
 Health Maintenance, Male  Adopting a healthy lifestyle and getting preventive care are important in promoting health and wellness. Ask your health care provider about:  The right schedule for you to have regular tests and exams.  Things you can do on your own to prevent diseases and keep yourself healthy.  What should I know about diet, weight, and exercise?  Eat a healthy diet    Eat a diet that includes plenty of vegetables, fruits, low-fat dairy products, and lean protein.  Do not eat a lot of foods that are high in solid fats, added sugars, or sodium.  Maintain a healthy weight  Body mass index (BMI) is a measurement that can be used to identify possible weight problems. It estimates body fat based on height and weight. Your health care provider can help determine your BMI and help you achieve or maintain a healthy weight.  Get regular exercise  Get regular exercise. This is one of the most important things you can do for your health. Most adults should:  Exercise for at least 150 minutes each week. The exercise should increase your heart rate and make you sweat (moderate-intensity exercise).  Do strengthening exercises at least twice a week. This is in addition to the moderate-intensity exercise.  Spend less time sitting. Even light physical activity can be beneficial.  Watch cholesterol and blood lipids  Have your blood tested for lipids and cholesterol at 82 years of age, then have this test every 5 years.  You may need to have your cholesterol levels checked more often if:  Your lipid or cholesterol levels are high.  You are older than 82 years of age.  You are at high risk for heart disease.  What should I know about cancer screening?  Many types of cancers can be detected early and may often be prevented. Depending on your health history and family history, you may need to have cancer screening at various ages. This may include screening for:  Colorectal cancer.  Prostate cancer.  Skin cancer.  Lung  cancer.  What should I know about heart disease, diabetes, and high blood pressure?  Blood pressure and heart disease  High blood pressure causes heart disease and increases the risk of stroke. This is more likely to develop in people who have high blood pressure readings or are overweight.  Talk with your health care provider about your target blood pressure readings.  Have your blood pressure checked:  Every 3-5 years if you are 24-52 years of age.  Every year if you are 3 years old or older.  If you are between the ages of 60 and 72 and are a current or former smoker, ask your health care provider if you should have a one-time screening for abdominal aortic aneurysm (AAA).  Diabetes  Have regular diabetes screenings. This checks your fasting blood sugar level. Have the screening done:  Once every three years after age 66 if you are at a normal weight and have a low risk for diabetes.  More often and at a younger age if you are overweight or have a high risk for diabetes.  What should I know about preventing infection?  Hepatitis B  If you have a higher risk for hepatitis B, you should be screened for this virus. Talk with your health care provider to find out if you are at risk for hepatitis B infection.  Hepatitis C  Blood testing is recommended for:  Everyone born from [birth year] through 1964.  Anyone  with known risk factors for hepatitis C.  Sexually transmitted infections (STIs)  You should be screened each year for STIs, including gonorrhea and chlamydia, if:  You are sexually active and are younger than 82 years of age.  You are older than 82 years of age and your health care provider tells you that you are at risk for this type of infection.  Your sexual activity has changed since you were last screened, and you are at increased risk for chlamydia or gonorrhea. Ask your health care provider if you are at risk.  Ask your health care provider about whether you are at high risk for HIV. Your health care provider  may recommend a prescription medicine to help prevent HIV infection. If you choose to take medicine to prevent HIV, you should first get tested for HIV. You should then be tested every 3 months for as long as you are taking the medicine.  Follow these instructions at home:  Alcohol use  Do not drink alcohol if your health care provider tells you not to drink.  If you drink alcohol:  Limit how much you have to 0-2 drinks a day.  Know how much alcohol is in your drink. In the [place] one drink equals one 12 oz bottle of beer (355 mL), one 5 oz glass of wine (148 mL), or one 1 oz glass of hard liquor (44 mL).  Lifestyle  Do not use any products that contain nicotine or tobacco. These products include cigarettes, chewing tobacco, and vaping devices, such as e-cigarettes. If you need help quitting, ask your health care provider.  Do not use street drugs.  Do not share needles.  Ask your health care provider for help if you need support or information about quitting drugs.  General instructions  Schedule regular health, dental, and eye exams.  Stay current with your vaccines.  Tell your health care provider if:  You often feel depressed.  You have ever been abused or do not feel safe at home.  Summary  Adopting a healthy lifestyle and getting preventive care are important in promoting health and wellness.  Follow your health care provider's instructions about healthy diet, exercising, and getting tested or screened for diseases.  Follow your health care provider's instructions on monitoring your cholesterol and blood pressure.  This information is not intended to replace advice given to you by your health care provider. Make sure you discuss any questions you have with your health care provider.  Document Revised: 03/04/2021 Document Reviewed: 03/04/2021  Elsevier Patient Education  2024 [employer].

## 2024-09-27 NOTE — Progress Notes (Unsigned)
 Subjective:  Patient ID: Jose Gamble, male    DOB: 05-09-1941  Age: 83 y.o. MRN: [id number]  CC: Annual Exam, Hypertension, and Hyperlipidemia   HPI [name] presents for a CPX and f/up ----  Discussed the use of AI scribe software for clinical note transcription with the patient, who gave verbal consent to proceed.  History of Present Illness Jose Gamble is an 82 year old male who presents for a routine annual checkup and medication refill.  He has run out of his two medications. No weakness, dizziness, or lightheadedness. He feels tired after staying up for 12 to 14 hours, leading him to rest earlier than usual.  He received a flu shot about a month ago and has had two or three COVID-19 vaccinations during the pandemic but none recently.  Despite having low platelets, he reports no issues with bleeding or bruising. No problems with bowel movements.  He does not believe any of his medications are causing side effects such as muscle or joint aches.     Outpatient Medications Prior to Visit  Medication Sig Dispense Refill   atorvastatin  (LIPITOR) 10 MG tablet Take 1 tablet (10 mg total) by mouth daily. TAKE 1 TABLET BY MOUTH DAILY. Patient to follow up with PCP prior to future refills 60 tablet 0   indapamide  (LOZOL ) 1.25 MG tablet Take 1 tablet (1.25 mg total) by mouth daily. NEEDS FOLLOW UP FOR REFILLS. 30 tablet 0   ketoconazole  (NIZORAL ) 2 % cream Apply 1 Application topically 2 (two) times daily. 60 g 2   moxifloxacin  (VIGAMOX ) 0.5 % ophthalmic solution Place 1 drop into both eyes 3 (three) times daily. 3 mL 0   triamcinolone  cream (KENALOG ) 0.5 % Apply 1 Application topically 3 (three) times daily. 30 g 1   No facility-administered medications prior to visit.    ROS Review of Systems  Objective:  BP (!) 140/74 (BP Location: Left Arm, Patient Position: Sitting, Cuff Size: Normal)   Pulse (!) 59   Temp 97.8 F (36.6 C) (Oral)   Resp 16   Ht 5' 10 (1.778 m)   Wt  181 lb 6.4 oz (82.3 kg)   SpO2 94%   BMI 26.03 kg/m   BP Readings from Last 3 Encounters:  09/27/24 (!) 140/74  05/17/24 (!) 146/66  11/12/23 138/82    Wt Readings from Last 3 Encounters:  09/27/24 181 lb 6.4 oz (82.3 kg)  11/12/23 186 lb 9.6 oz (84.6 kg)  05/12/23 185 lb (83.9 kg)    Physical Exam Cardiovascular:     Rate and Rhythm: Bradycardia present.     Heart sounds: Murmur heard.     Systolic murmur is present with a grade of 1/6.     Comments: EKG--- SB, 54 bpm LAD LBBB (new) Musculoskeletal:     Right lower leg: No edema.     Left lower leg: No edema.     Lab Results  Component Value Date   WBC 6.8 09/27/2024   HGB 14.6 09/27/2024   HCT 43.8 09/27/2024   PLT 180.0 09/27/2024   GLUCOSE 70 09/27/2024   CHOL 156 09/27/2024   TRIG 261.0 (H) 09/27/2024   HDL 38.50 (L) 09/27/2024   LDLDIRECT 90.0 05/12/2023   LDLCALC 65 09/27/2024   ALT 14 09/27/2024   AST 16 09/27/2024   NA 143 09/27/2024   K 4.1 09/27/2024   CL 103 09/27/2024   CREATININE 0.94 09/27/2024   BUN 18 09/27/2024   CO2 34 (  H) 09/27/2024   TSH 1.70 09/27/2024   PSA 0.83 09/27/2024   HGBA1C 5.8 03/07/2010    No results found.  Assessment & Plan:  Thrombocytopenia -     Hepatic function panel; Future -     CBC with Differential/Platelet; Future -     Vitamin B12; Future -     Methylmalonic acid, serum; Future  Essential hypertension -     TSH; Future -     Urinalysis, Routine w reflex microscopic; Future -     Hepatic function panel; Future -     Basic metabolic panel with GFR; Future -     EKG 12-Lead -     Indapamide ; Take 1 tablet (1.25 mg total) by mouth daily.  Dispense: 90 tablet; Refill: 0  Hyperlipidemia with target LDL less than 100 -     Lipid panel; Future -     TSH; Future -     Hepatic function panel; Future -     Atorvastatin  Calcium ; Take 1 tablet (10 mg total) by mouth daily.  Dispense: 90 tablet; Refill: 0  Encounter for general adult medical examination  with abnormal findings  Prostate cancer screening -     PSA; Future  Bradycardia -     EKG 12-Lead  LBBB (left bundle branch block) -     ECHOCARDIOGRAM COMPLETE; Future     Follow-up: Return in about 6 months (around 03/28/2025).  Jose Molt, MD

## 2024-09-28 ENCOUNTER — Ambulatory Visit: Payer: Self-pay | Admitting: Internal Medicine

## 2024-09-30 LAB — METHYLMALONIC ACID, SERUM: Methylmalonic Acid, Quant: 160 nmol/L (ref 85–423)

## 2024-10-18 ENCOUNTER — Telehealth (HOSPITAL_COMMUNITY): Payer: Self-pay | Admitting: Internal Medicine

## 2024-10-18 NOTE — Telephone Encounter (Signed)
 We have attempted to contact the ordering providers office to obtain a prior authorization for the ordered test.  However, we have been unsuccesful. Order will be removed from the active order WQ. Once the prior authorization is obtained we will reinstate the order and schedule patient.    10/11/24 Inbasket sent for Final attempt for PA# 10/04/24 Inbasket sent x 2 for PA#  09/27/24 Inbasket sent for PA#     Thank you

## 2024-11-02 ENCOUNTER — Ambulatory Visit (HOSPITAL_COMMUNITY)
Admission: RE | Admit: 2024-11-02 | Discharge: 2024-11-02 | Disposition: A | Discharge status: Home / Self Care | Source: Ambulatory Visit | Attending: Internal Medicine | Admitting: Internal Medicine

## 2024-11-02 DIAGNOSIS — I447 Left bundle-branch block, unspecified: Secondary | ICD-10-CM | POA: Insufficient documentation

## 2024-11-02 LAB — ECHOCARDIOGRAM COMPLETE
AR max vel: 2.7 cm2
AV Area VTI: 2.82 cm2
AV Area mean vel: 2.74 cm2
AV Mean grad: 4 mmHg
AV Peak grad: 7.2 mmHg
Ao pk vel: 1.34 m/s
Area-P 1/2: 2.14 cm2
S' Lateral: 2.89 cm
